# Patient Record
Sex: Female | Born: 1970 | Race: White | Hispanic: No | Marital: Single | Smoking: Never smoker
Health system: Southern US, Community
[De-identification: ages and names within clinical notes are randomized; demographics above are authoritative.]

## PROBLEM LIST (undated history)

## (undated) DIAGNOSIS — G8929 Other chronic pain: Secondary | ICD-10-CM

## (undated) DIAGNOSIS — K219 Gastro-esophageal reflux disease without esophagitis: Secondary | ICD-10-CM

## (undated) DIAGNOSIS — M545 Low back pain: Secondary | ICD-10-CM

## (undated) DIAGNOSIS — R51 Headache: Secondary | ICD-10-CM

## (undated) HISTORY — DX: Low back pain: M54.5

## (undated) HISTORY — DX: Other chronic pain: G89.29

## (undated) HISTORY — DX: Gastro-esophageal reflux disease without esophagitis: K21.9

## (undated) HISTORY — PX: TUBAL LIGATION: SHX77

## (undated) HISTORY — DX: Headache: R51

---

## 1997-09-28 ENCOUNTER — Other Ambulatory Visit: Admission: RE | Admit: 1997-09-28 | Discharge: 1997-09-28 | Payer: Self-pay | Admitting: Obstetrics and Gynecology

## 1998-08-06 ENCOUNTER — Other Ambulatory Visit: Admission: RE | Admit: 1998-08-06 | Discharge: 1998-08-06 | Payer: Self-pay | Admitting: Gynecology

## 1999-03-10 ENCOUNTER — Inpatient Hospital Stay (HOSPITAL_COMMUNITY): Admission: AD | Admit: 1999-03-10 | Discharge: 1999-03-12 | Payer: Self-pay | Admitting: Gynecology

## 1999-04-24 ENCOUNTER — Other Ambulatory Visit: Admission: RE | Admit: 1999-04-24 | Discharge: 1999-04-24 | Payer: Self-pay | Admitting: Gynecology

## 2000-05-26 ENCOUNTER — Other Ambulatory Visit: Admission: RE | Admit: 2000-05-26 | Discharge: 2000-05-26 | Payer: Self-pay | Admitting: Gynecology

## 2003-07-20 ENCOUNTER — Ambulatory Visit (HOSPITAL_COMMUNITY): Admission: RE | Admit: 2003-07-20 | Discharge: 2003-07-20 | Payer: Self-pay | Admitting: Radiology

## 2005-11-28 ENCOUNTER — Other Ambulatory Visit: Admission: RE | Admit: 2005-11-28 | Discharge: 2005-11-28 | Payer: Self-pay | Admitting: Obstetrics and Gynecology

## 2006-04-27 ENCOUNTER — Inpatient Hospital Stay (HOSPITAL_COMMUNITY): Admission: AD | Admit: 2006-04-27 | Discharge: 2006-04-27 | Payer: Self-pay | Admitting: Obstetrics and Gynecology

## 2006-06-10 ENCOUNTER — Inpatient Hospital Stay (HOSPITAL_COMMUNITY): Admission: RE | Admit: 2006-06-10 | Discharge: 2006-06-12 | Payer: Self-pay | Admitting: Obstetrics and Gynecology

## 2008-04-07 DIAGNOSIS — M545 Low back pain, unspecified: Secondary | ICD-10-CM

## 2008-04-07 DIAGNOSIS — G8929 Other chronic pain: Secondary | ICD-10-CM

## 2008-04-07 HISTORY — DX: Other chronic pain: G89.29

## 2008-04-07 HISTORY — DX: Low back pain, unspecified: M54.50

## 2009-04-07 DIAGNOSIS — R519 Headache, unspecified: Secondary | ICD-10-CM

## 2009-04-07 HISTORY — DX: Headache, unspecified: R51.9

## 2010-07-11 ENCOUNTER — Emergency Department (HOSPITAL_COMMUNITY)
Admission: EM | Admit: 2010-07-11 | Discharge: 2010-07-11 | Disposition: A | Discharge status: Home / Self Care | Payer: Managed Care, Other (non HMO) | Attending: Emergency Medicine | Admitting: Emergency Medicine

## 2010-07-11 DIAGNOSIS — K089 Disorder of teeth and supporting structures, unspecified: Secondary | ICD-10-CM | POA: Insufficient documentation

## 2010-07-11 DIAGNOSIS — K029 Dental caries, unspecified: Secondary | ICD-10-CM | POA: Insufficient documentation

## 2012-07-22 ENCOUNTER — Other Ambulatory Visit (INDEPENDENT_AMBULATORY_CARE_PROVIDER_SITE_OTHER): Payer: Managed Care, Other (non HMO)

## 2012-07-22 ENCOUNTER — Encounter: Payer: Self-pay | Admitting: Internal Medicine

## 2012-07-22 ENCOUNTER — Ambulatory Visit (INDEPENDENT_AMBULATORY_CARE_PROVIDER_SITE_OTHER): Payer: Managed Care, Other (non HMO) | Admitting: Internal Medicine

## 2012-07-22 VITALS — BP 116/78 | HR 68 | Temp 99.0°F | Resp 12 | Ht 64.0 in | Wt 197.4 lb

## 2012-07-22 DIAGNOSIS — Z8742 Personal history of other diseases of the female genital tract: Secondary | ICD-10-CM

## 2012-07-22 DIAGNOSIS — R110 Nausea: Secondary | ICD-10-CM

## 2012-07-22 DIAGNOSIS — R634 Abnormal weight loss: Secondary | ICD-10-CM

## 2012-07-22 DIAGNOSIS — Z87898 Personal history of other specified conditions: Secondary | ICD-10-CM

## 2012-07-22 DIAGNOSIS — K219 Gastro-esophageal reflux disease without esophagitis: Secondary | ICD-10-CM

## 2012-07-22 LAB — URINALYSIS, ROUTINE W REFLEX MICROSCOPIC
Bilirubin Urine: NEGATIVE
Ketones, ur: NEGATIVE
Leukocytes, UA: NEGATIVE
Specific Gravity, Urine: >=1.03 (ref 1.000–1.030)
Total Protein, Urine: NEGATIVE
Urine Glucose: NEGATIVE
pH: 6 (ref 5.0–8.0)

## 2012-07-22 LAB — CBC WITH DIFFERENTIAL/PLATELET
Basophils Absolute: 0 10*3/uL (ref 0.0–0.1)
Eosinophils Relative: 12.1 % — ABNORMAL HIGH (ref 0.0–5.0)
HCT: 24.1 % — ABNORMAL LOW (ref 36.0–46.0)
Lymphs Abs: 1.3 10*3/uL (ref 0.7–4.0)
MCV: 53.9 fl — ABNORMAL LOW (ref 78.0–100.0)
Monocytes Absolute: 0.6 10*3/uL (ref 0.1–1.0)
Neutrophils Relative %: 53.1 % (ref 43.0–77.0)
Platelets: 275 10*3/uL (ref 150.0–400.0)
RDW: 20.2 % — ABNORMAL HIGH (ref 11.5–14.6)
WBC: 5.6 10*3/uL (ref 4.5–10.5)

## 2012-07-22 LAB — LIPID PANEL
Cholesterol: 154 mg/dL (ref 0–200)
HDL: 46.9 mg/dL (ref >39.00)
LDL Cholesterol: 98 mg/dL (ref 0–99)
VLDL: 9.4 mg/dL (ref 0.0–40.0)

## 2012-07-22 LAB — COMPREHENSIVE METABOLIC PANEL
ALT: 16 U/L (ref 0–35)
AST: 16 U/L (ref 0–37)
Alkaline Phosphatase: 68 U/L (ref 39–117)
CO2: 23 mEq/L (ref 19–32)
Creatinine, Ser: 0.5 mg/dL (ref 0.4–1.2)
Sodium: 138 mEq/L (ref 135–145)
Total Bilirubin: 0.5 mg/dL (ref 0.3–1.2)
Total Protein: 7 g/dL (ref 6.0–8.3)

## 2012-07-22 LAB — HCG, QUANTITATIVE, PREGNANCY: hCG, Beta Chain, Quant, S: 0.14 m[IU]/mL

## 2012-07-22 LAB — TSH: TSH: 3.39 u[IU]/mL (ref 0.35–5.50)

## 2012-07-22 LAB — LIPASE: Lipase: 23 U/L (ref 11.0–59.0)

## 2012-07-22 LAB — AMYLASE: Amylase: 35 U/L (ref 27–131)

## 2012-07-22 MED ORDER — ONDANSETRON HCL 4 MG PO TABS: 4.0000 mg | ORAL_TABLET | Freq: Three times a day (TID) | ORAL | Status: DC | PRN

## 2012-07-22 NOTE — Progress Notes (Signed)
Subjective:    Patient ID: Barbara Perry, female    DOB: [birth year], 42 y.o.   MRN: [id number]  Gastrophageal Reflux She complains of heartburn and nausea. She reports no abdominal pain, no belching, no chest pain, no choking, no coughing, no dysphagia, no early satiety, no globus sensation, no hoarse voice, no sore throat, no stridor, no tooth decay, no water brash or no wheezing. This is a chronic problem. The current episode started more than 1 year ago. The problem occurs occasionally. The problem has been unchanged. The symptoms are aggravated by medications. Associated symptoms include fatigue and weight loss. Pertinent negatives include no anemia, melena, muscle weakness or orthopnea. Risk factors include NSAIDs and obesity. She has tried a histamine-2 antagonist for the symptoms. The treatment provided moderate relief.      Review of Systems  Constitutional: Positive for weight loss, fatigue and unexpected weight change (weight loss). Negative for fever, chills, diaphoresis, activity change and appetite change.  HENT: Negative.  Negative for nosebleeds, sore throat, hoarse voice, trouble swallowing and voice change.   Eyes: Negative.   Respiratory: Negative.  Negative for cough, choking and wheezing.   Cardiovascular: Negative.  Negative for chest pain, palpitations and leg swelling.  Gastrointestinal: Positive for heartburn and nausea. Negative for dysphagia, vomiting, abdominal pain, diarrhea, constipation, melena, abdominal distention, anal bleeding and rectal pain.  Endocrine: Negative.   Genitourinary: Positive for vaginal bleeding and menstrual problem (heavy and irregular periods). Negative for hematuria, vaginal discharge and vaginal pain.  Musculoskeletal: Negative.  Negative for myalgias, back pain, joint swelling, gait problem and muscle weakness.  Skin: Negative.   Allergic/Immunologic: Negative.   Neurological: Negative.  Negative for dizziness, tremors, seizures,  syncope, facial asymmetry, speech difficulty, weakness, light-headedness and numbness.  Hematological: Negative.  Negative for adenopathy. Does not bruise/bleed easily.  Psychiatric/Behavioral: Negative.        Objective:   Physical Exam  Vitals reviewed. Constitutional: She is oriented to person, place, and time. She appears well-developed and well-nourished.  Non-toxic appearance. She does not have a sickly appearance. She does not appear ill. No distress.  HENT:  Head: Normocephalic and atraumatic.  Mouth/Throat: Oropharynx is clear and moist. No oropharyngeal exudate.  Eyes: Conjunctivae and EOM are normal. Right eye exhibits no discharge. Left eye exhibits no discharge. No scleral icterus.  Neck: Normal range of motion. Neck supple. No JVD present. No tracheal deviation present. No thyromegaly present.  Cardiovascular: Normal rate, regular rhythm, normal heart sounds and intact distal pulses.  Exam reveals no gallop and no friction rub.   No murmur heard. Pulmonary/Chest: Effort normal and breath sounds normal. No stridor. No respiratory distress. She has no wheezes. She has no rales. She exhibits no tenderness.  Abdominal: Soft. Normal appearance and bowel sounds are normal. She exhibits no shifting dullness, no distension, no pulsatile liver, no fluid wave, no abdominal bruit, no ascites, no pulsatile midline mass and no mass. There is no hepatosplenomegaly, splenomegaly or hepatomegaly. There is no tenderness. There is no rigidity, no rebound, no guarding, no CVA tenderness, no tenderness at McBurney's point and negative Murphy's sign. No hernia. Hernia confirmed negative in the ventral area, confirmed negative in the right inguinal area and confirmed negative in the left inguinal area.  Genitourinary: Rectum normal. Rectal exam shows no external hemorrhoid, no internal hemorrhoid, no fissure, no mass, no tenderness and anal tone normal. Guaiac negative stool.  Musculoskeletal: Normal  range of motion. She exhibits no edema and no tenderness.  Lymphadenopathy:  She has no cervical adenopathy.  Neurological: She is alert and oriented to person, place, and time. She has normal strength. She displays no atrophy, no tremor and normal reflexes. No cranial nerve deficit or sensory deficit. She exhibits normal muscle tone. She displays a negative Romberg sign. She displays no seizure activity. Coordination and gait normal. She displays no Babinski's sign on the right side. She displays no Babinski's sign on the left side.  Reflex Scores:      Tricep reflexes are 1+ on the right side and 1+ on the left side.      Bicep reflexes are 1+ on the right side and 1+ on the left side.      Brachioradialis reflexes are 1+ on the right side and 1+ on the left side.      Patellar reflexes are 1+ on the right side and 1+ on the left side.      Achilles reflexes are 1+ on the right side and 1+ on the left side. Skin: Skin is warm and dry. No rash noted. She is not diaphoretic. No erythema. No pallor.  Psychiatric: She has a normal mood and affect. Her behavior is normal. Judgment and thought content normal.     No results found for this basename: WBC, HGB, HCT, PLT, GLUCOSE, CHOL, TRIG, HDL, LDLDIRECT, LDLCALC, ALT, AST, NA, K, CL, CREATININE, BUN, CO2, TSH, PSA, INR, GLUF, HGBA1C, MICROALBUR       Assessment & Plan:

## 2012-07-22 NOTE — Patient Instructions (Signed)
Nausea and Vomiting Nausea is a sick feeling that often comes before throwing up (vomiting). Vomiting is a reflex where stomach contents come out of your mouth. Vomiting can cause severe loss of body fluids (dehydration). Children and elderly adults can become dehydrated quickly, especially if they also have diarrhea. Nausea and vomiting are symptoms of a condition or disease. It is important to find the cause of your symptoms. CAUSES   Direct irritation of the stomach lining. This irritation can result from increased acid production (gastroesophageal reflux disease), infection, food poisoning, taking certain medicines (such as nonsteroidal anti-inflammatory drugs), alcohol use, or tobacco use.  Signals from the brain.These signals could be caused by a headache, heat exposure, an inner ear disturbance, increased pressure in the brain from injury, infection, a tumor, or a concussion, pain, emotional stimulus, or metabolic problems.  An obstruction in the gastrointestinal tract (bowel obstruction).  Illnesses such as diabetes, hepatitis, gallbladder problems, appendicitis, kidney problems, cancer, sepsis, atypical symptoms of a heart attack, or eating disorders.  Medical treatments such as chemotherapy and radiation.  Receiving medicine that makes you sleep (general anesthetic) during surgery. DIAGNOSIS Your caregiver may ask for tests to be done if the problems do not improve after a few days. Tests may also be done if symptoms are severe or if the reason for the nausea and vomiting is not clear. Tests may include:  Urine tests.  Blood tests.  Stool tests.  Cultures (to look for evidence of infection).  X-rays or other imaging studies. Test results can help your caregiver make decisions about treatment or the need for additional tests. TREATMENT You need to stay well hydrated. Drink frequently but in small amounts.You may wish to drink water, sports drinks, clear broth, or eat frozen  ice pops or gelatin dessert to help stay hydrated.When you eat, eating slowly may help prevent nausea.There are also some antinausea medicines that may help prevent nausea. HOME CARE INSTRUCTIONS   Take all medicine as directed by your caregiver.  If you do not have an appetite, do not force yourself to eat. However, you must continue to drink fluids.  If you have an appetite, eat a normal diet unless your caregiver tells you differently.  Eat a variety of complex carbohydrates (rice, wheat, potatoes, bread), lean meats, yogurt, fruits, and vegetables.  Avoid high-fat foods because they are more difficult to digest.  Drink enough water and fluids to keep your urine clear or pale yellow.  If you are dehydrated, ask your caregiver for specific rehydration instructions. Signs of dehydration may include:  Severe thirst.  Dry lips and mouth.  Dizziness.  Dark urine.  Decreasing urine frequency and amount.  Confusion.  Rapid breathing or pulse. SEEK IMMEDIATE MEDICAL CARE IF:   You have blood or brown flecks (like coffee grounds) in your vomit.  You have black or bloody stools.  You have a severe headache or stiff neck.  You are confused.  You have severe abdominal pain.  You have chest pain or trouble breathing.  You do not urinate at least once every 8 hours.  You develop cold or clammy skin.  You continue to vomit for longer than 24 to 48 hours.  You have a fever. MAKE SURE YOU:   Understand these instructions.  Will watch your condition.  Will get help right away if you are not doing well or get worse. Document Released: 03/24/2005 Document Revised: 06/16/2011 Document Reviewed: 08/21/2010 Exit[employer] Patient Information 2013 [employer].  

## 2012-07-23 ENCOUNTER — Encounter: Payer: Self-pay | Admitting: Internal Medicine

## 2012-07-25 NOTE — Assessment & Plan Note (Signed)
Continue zantac I may refer to GI to consider an UGI evaluation depending on the lab results

## 2012-07-25 NOTE — Assessment & Plan Note (Signed)
GYN referral 

## 2012-07-25 NOTE — Assessment & Plan Note (Signed)
I will check her labs to look for secondary causes

## 2012-07-25 NOTE — Assessment & Plan Note (Addendum)
I think this is caused by the GERD, I will check labs today to look for other causes (preganacy, pancreatitis, liver disease, thyroid disease, renal disease). She will take zofran as needed for nausea.

## 2012-07-26 ENCOUNTER — Telehealth: Payer: Self-pay

## 2012-07-26 ENCOUNTER — Encounter: Payer: Self-pay | Admitting: Internal Medicine

## 2012-07-26 ENCOUNTER — Encounter: Payer: Self-pay | Admitting: *Deleted

## 2012-07-26 ENCOUNTER — Other Ambulatory Visit (INDEPENDENT_AMBULATORY_CARE_PROVIDER_SITE_OTHER): Payer: Managed Care, Other (non HMO)

## 2012-07-26 ENCOUNTER — Ambulatory Visit (INDEPENDENT_AMBULATORY_CARE_PROVIDER_SITE_OTHER): Payer: Managed Care, Other (non HMO) | Admitting: Internal Medicine

## 2012-07-26 VITALS — BP 108/80 | HR 88 | Temp 98.7°F | Resp 12 | Wt 198.4 lb

## 2012-07-26 DIAGNOSIS — Z1231 Encounter for screening mammogram for malignant neoplasm of breast: Secondary | ICD-10-CM | POA: Insufficient documentation

## 2012-07-26 DIAGNOSIS — D509 Iron deficiency anemia, unspecified: Secondary | ICD-10-CM | POA: Insufficient documentation

## 2012-07-26 DIAGNOSIS — D539 Nutritional anemia, unspecified: Secondary | ICD-10-CM

## 2012-07-26 LAB — FERRITIN: Ferritin: 1.2 ng/mL — ABNORMAL LOW (ref 10.0–291.0)

## 2012-07-26 LAB — FOLATE: Folate: 9.7 ng/mL (ref >5.9)

## 2012-07-26 LAB — CBC WITH DIFFERENTIAL/PLATELET
Basophils Absolute: 0 10*3/uL (ref 0.0–0.1)
Basophils Relative: 0 % (ref 0.0–3.0)
Eosinophils Absolute: 0.6 10*3/uL (ref 0.0–0.7)
Hemoglobin: 7.1 g/dL — CL (ref 12.0–15.0)
Lymphocytes Relative: 23.1 % (ref 12.0–46.0)
MCHC: 29.9 g/dL — ABNORMAL LOW (ref 30.0–36.0)
Monocytes Relative: 8.8 % (ref 3.0–12.0)
Neutrophils Relative %: 55.9 % (ref 43.0–77.0)
RBC: 4.4 Mil/uL (ref 3.87–5.11)

## 2012-07-26 LAB — IBC PANEL
Iron: 9 ug/dL — ABNORMAL LOW (ref 42–145)
Saturation Ratios: 1.9 % — ABNORMAL LOW (ref 20.0–50.0)

## 2012-07-26 MED ORDER — FERROUS SULFATE 325 (65 FE) MG PO TABS: 325.0000 mg | ORAL_TABLET | Freq: Three times a day (TID) | ORAL | Status: DC

## 2012-07-26 NOTE — Assessment & Plan Note (Signed)
Start iron replacement therapy She has been referred to GYN for further evaluation of the heavy periods

## 2012-07-26 NOTE — Patient Instructions (Signed)

## 2012-07-26 NOTE — Progress Notes (Signed)
  Subjective:    Patient ID: Barbara Perry, female    DOB: [birth year], 42 y.o.   MRN: [id number]  Anemia Presents for follow-up visit. Symptoms include pica. There has been no abdominal pain, anorexia, bruising/bleeding easily, confusion, fever, leg swelling, light-headedness, malaise/fatigue, pallor, palpitations, paresthesias or weight loss. Signs of blood loss that are present include menorrhagia. Signs of blood loss that are not present include hematemesis, hematochezia and melena. There are no compliance problems.       Review of Systems  Constitutional: Negative.  Negative for fever, chills, weight loss, malaise/fatigue, diaphoresis, activity change, appetite change, fatigue and unexpected weight change.  HENT: Negative.   Eyes: Negative.   Respiratory: Negative.  Negative for cough and chest tightness.   Cardiovascular: Negative.  Negative for chest pain, palpitations and leg swelling.  Gastrointestinal: Negative.  Negative for nausea, vomiting, abdominal pain, diarrhea, constipation, blood in stool, melena, hematochezia, abdominal distention, anal bleeding, rectal pain, anorexia and hematemesis.  Endocrine: Negative.   Genitourinary: Positive for menorrhagia. Negative for hematuria.  Musculoskeletal: Negative.   Skin: Negative.  Negative for pallor.  Allergic/Immunologic: Negative.   Neurological: Negative.  Negative for dizziness, weakness, light-headedness, numbness and paresthesias.  Hematological: Negative.  Negative for adenopathy. Does not bruise/bleed easily.  Psychiatric/Behavioral: Negative.  Negative for confusion.       Objective:   Physical Exam  Vitals reviewed. Constitutional: She is oriented to person, place, and time. She appears well-developed and well-nourished. No distress.  HENT:  Head: Normocephalic and atraumatic.  Mouth/Throat: Oropharynx is clear and moist. No oropharyngeal exudate.  Eyes: Conjunctivae are normal. Right eye exhibits no discharge.  Left eye exhibits no discharge. No scleral icterus.  Neck: Normal range of motion. Neck supple. No JVD present. No tracheal deviation present. No thyromegaly present.  Cardiovascular: Normal rate, regular rhythm, normal heart sounds and intact distal pulses.  Exam reveals no gallop and no friction rub.   No murmur heard. Pulmonary/Chest: Effort normal and breath sounds normal. No stridor. No respiratory distress. She has no wheezes. She has no rales. She exhibits no tenderness.  Abdominal: Soft. Bowel sounds are normal. She exhibits no distension and no mass. There is no tenderness. There is no rebound and no guarding.  Musculoskeletal: Normal range of motion. She exhibits no edema and no tenderness.  Lymphadenopathy:    She has no cervical adenopathy.  Neurological: She is oriented to person, place, and time.  Skin: Skin is warm and dry. No rash noted. She is not diaphoretic. No erythema. No pallor.  Psychiatric: She has a normal mood and affect. Her behavior is normal. Judgment and thought content normal.     Lab Results  Component Value Date   WBC 5.6 07/22/2012   HGB 7.2 Repeated and verified X2.* 07/22/2012   HCT 24.1 Repeated and verified X2.* 07/22/2012   PLT 275.0 07/22/2012   GLUCOSE 86 07/22/2012   CHOL 154 07/22/2012   TRIG 47.0 07/22/2012   HDL 46.90 07/22/2012   LDLCALC 98 07/22/2012   ALT 16 07/22/2012   AST 16 07/22/2012   NA 138 07/22/2012   K 4.2 07/22/2012   CL 109 07/22/2012   CREATININE 0.5 07/22/2012   BUN 9 07/22/2012   CO2 23 07/22/2012   TSH 3.39 07/22/2012       Assessment & Plan:

## 2012-07-26 NOTE — Addendum Note (Signed)
Addended by: Lyanne Co R on: 07/26/2012 04:42 PM   Modules accepted: Orders

## 2012-07-26 NOTE — Progress Notes (Signed)
Phone call to pt, LM for her to call back with what pharmacy she uses so a prescription can be sent over for her.   Ferrous Sulfate electronically sent to [facility] on [address] per pt

## 2012-07-26 NOTE — Telephone Encounter (Signed)
Critical Lab - Hemoglobin 701 Hematocrit 23.5

## 2012-08-16 ENCOUNTER — Ambulatory Visit: Payer: Managed Care, Other (non HMO) | Admitting: Internal Medicine

## 2012-08-16 ENCOUNTER — Ambulatory Visit (HOSPITAL_COMMUNITY)
Admission: RE | Admit: 2012-08-16 | Discharge: 2012-08-16 | Disposition: A | Discharge status: Home / Self Care | Payer: Managed Care, Other (non HMO) | Source: Ambulatory Visit | Attending: Internal Medicine | Admitting: Internal Medicine

## 2012-08-16 DIAGNOSIS — Z1231 Encounter for screening mammogram for malignant neoplasm of breast: Secondary | ICD-10-CM | POA: Insufficient documentation

## 2012-08-18 ENCOUNTER — Encounter: Payer: Managed Care, Other (non HMO) | Admitting: Obstetrics & Gynecology

## 2012-08-25 ENCOUNTER — Ambulatory Visit: Payer: Managed Care, Other (non HMO) | Admitting: Internal Medicine

## 2012-09-03 ENCOUNTER — Encounter: Payer: Self-pay | Admitting: Internal Medicine

## 2012-09-03 ENCOUNTER — Ambulatory Visit (INDEPENDENT_AMBULATORY_CARE_PROVIDER_SITE_OTHER): Payer: Managed Care, Other (non HMO) | Admitting: Internal Medicine

## 2012-09-03 ENCOUNTER — Other Ambulatory Visit (INDEPENDENT_AMBULATORY_CARE_PROVIDER_SITE_OTHER): Payer: Managed Care, Other (non HMO)

## 2012-09-03 VITALS — BP 110/78 | HR 72 | Temp 98.6°F | Resp 16 | Ht 64.0 in | Wt 201.0 lb

## 2012-09-03 DIAGNOSIS — D519 Vitamin B12 deficiency anemia, unspecified: Secondary | ICD-10-CM

## 2012-09-03 DIAGNOSIS — D518 Other vitamin B12 deficiency anemias: Secondary | ICD-10-CM

## 2012-09-03 DIAGNOSIS — E669 Obesity, unspecified: Secondary | ICD-10-CM

## 2012-09-03 DIAGNOSIS — D509 Iron deficiency anemia, unspecified: Secondary | ICD-10-CM

## 2012-09-03 LAB — FERRITIN: Ferritin: 31.7 ng/mL (ref 10.0–291.0)

## 2012-09-03 LAB — CBC WITH DIFFERENTIAL/PLATELET
Basophils Absolute: 0 10*3/uL (ref 0.0–0.1)
Eosinophils Absolute: 0.1 10*3/uL (ref 0.0–0.7)
HCT: 35.3 % — ABNORMAL LOW (ref 36.0–46.0)
Hemoglobin: 11.4 g/dL — ABNORMAL LOW (ref 12.0–15.0)
Lymphs Abs: 1.3 10*3/uL (ref 0.7–4.0)
MCHC: 32.2 g/dL (ref 30.0–36.0)
Neutro Abs: 3.3 10*3/uL (ref 1.4–7.7)
Platelets: 224 10*3/uL (ref 150.0–400.0)
RDW: 34.1 % — ABNORMAL HIGH (ref 11.5–14.6)

## 2012-09-03 LAB — IBC PANEL: Iron: 24 ug/dL — ABNORMAL LOW (ref 42–145)

## 2012-09-03 MED ORDER — LORCASERIN HCL 10 MG PO TABS
1.0000 | ORAL_TABLET | Freq: Two times a day (BID) | ORAL | Status: DC
Start: 2012-09-03 — End: 2012-12-27

## 2012-09-03 MED ORDER — LORCASERIN HCL 10 MG PO TABS: 1.0000 | ORAL_TABLET | Freq: Two times a day (BID) | ORAL | Status: DC

## 2012-09-03 MED ORDER — CYANOCOBALAMIN 100 MCG PO TABS: 100.0000 ug | ORAL_TABLET | Freq: Every day | ORAL | Status: DC

## 2012-09-03 NOTE — Assessment & Plan Note (Signed)
CBC and iron level look much better today

## 2012-09-03 NOTE — Assessment & Plan Note (Signed)
She wants to lose weight so I recommended belviq

## 2012-09-03 NOTE — Patient Instructions (Signed)
Anemia, Nonspecific  Your exam and blood tests show you are anemic. This means your blood (hemoglobin) level is low. Normal hemoglobin values are 12 to 15 g/dL for females and 14 to 17 g/dL for males. Make a note of your hemoglobin level today. The hematocrit percent is also used to measure anemia. A normal hematocrit is 38% to 46% in females and 42% to 49% in males. Make a note of your hematocrit level today.  CAUSES   Anemia can be due to many different causes.   Excessive bleeding from periods (in women).   Intestinal bleeding.   Poor nutrition.   Kidney, thyroid, liver, and bone marrow diseases.  SYMPTOMS   Anemia can come on suddenly (acute). It can also come on slowly. Symptoms can include:   Minor weakness.   Dizziness.   Palpitations.   Shortness of breath.  Symptoms may be absent until half your hemoglobin is missing if it comes on slowly. Anemia due to acute blood loss from an injury or internal bleeding may require blood transfusion if the loss is severe. Hospital care is needed if you are anemic and there is significant continual blood loss.  TREATMENT    Stool tests for blood (Hemoccult) and additional lab tests are often needed. This determines the best treatment.   Further checking on your condition and your response to treatment is very important. It often takes many weeks to correct anemia.  Depending on the cause, treatment can include:   Supplements of iron.   Vitamins B12 and folic acid.   Hormone medicines.  If your anemia is due to bleeding, finding the cause of the blood loss is very important. This will help avoid further problems.  SEEK IMMEDIATE MEDICAL CARE IF:    You develop fainting, extreme weakness, shortness of breath, or chest pain.   You develop heavy vaginal bleeding.   You develop bloody or black, tarry stools or vomit up blood.   You develop a high fever, rash, repeated vomiting, or dehydration.  Document Released: 05/01/2004 Document Revised: 06/16/2011 Document  Reviewed: 02/06/2009  [employer] Patient Information 2014 [employer].

## 2012-09-03 NOTE — Assessment & Plan Note (Addendum)
Start oral B12 replacements I will check her labs for celiac disease today

## 2012-09-03 NOTE — Progress Notes (Signed)
Subjective:    Patient ID: Barbara Perry, female    DOB: Sep 18, 1970, 42 y.o.   MRN: [id number]  Anemia Presents for follow-up visit. Symptoms include malaise/fatigue and paresthesias. There has been no abdominal pain, anorexia, bruising/bleeding easily, confusion, fever, leg swelling, light-headedness, pallor, palpitations, pica or weight loss. Signs of blood loss that are not present include hematemesis, hematochezia, melena, menorrhagia and vaginal bleeding. There are no compliance problems.  Compliance with medications is 76-100%.      Review of Systems  Constitutional: Positive for malaise/fatigue, fatigue and unexpected weight change (weight gain). Negative for fever, chills, weight loss, diaphoresis, activity change and appetite change.  HENT: Negative.   Eyes: Negative.   Respiratory: Negative.  Negative for cough, chest tightness, shortness of breath, wheezing and stridor.   Cardiovascular: Negative.  Negative for chest pain, palpitations and leg swelling.  Gastrointestinal: Negative.  Negative for nausea, vomiting, abdominal pain, diarrhea, constipation, melena, hematochezia, abdominal distention, anal bleeding, rectal pain, anorexia and hematemesis.  Endocrine: Negative.   Genitourinary: Negative.  Negative for frequency, hematuria, flank pain, vaginal bleeding and menorrhagia.  Musculoskeletal: Negative.  Negative for myalgias, back pain and joint swelling.  Skin: Negative.  Negative for color change, pallor, rash and wound.  Allergic/Immunologic: Negative.   Neurological: Positive for numbness (in her hands) and paresthesias. Negative for dizziness, weakness and light-headedness.  Hematological: Negative.  Does not bruise/bleed easily.  Psychiatric/Behavioral: Negative.  Negative for confusion.       Objective:   Physical Exam  Vitals reviewed. Constitutional: She is oriented to person, place, and time. She appears well-developed and well-nourished. No distress.  HENT:   Head: Normocephalic and atraumatic.  Mouth/Throat: Oropharynx is clear and moist. No oropharyngeal exudate.  Eyes: Conjunctivae are normal. Right eye exhibits no discharge. Left eye exhibits no discharge. No scleral icterus.  Neck: Normal range of motion. Neck supple. No JVD present. No tracheal deviation present. No thyromegaly present.  Cardiovascular: Normal rate, regular rhythm, normal heart sounds and intact distal pulses.  Exam reveals no gallop and no friction rub.   No murmur heard. Pulmonary/Chest: Effort normal and breath sounds normal. No stridor. No respiratory distress. She has no wheezes. She has no rales. She exhibits no tenderness.  Abdominal: Soft. Bowel sounds are normal. She exhibits no distension and no mass. There is no tenderness. There is no rebound and no guarding.  Musculoskeletal: Normal range of motion. She exhibits no edema and no tenderness.  Lymphadenopathy:    She has no cervical adenopathy.  Neurological: She is alert and oriented to person, place, and time. She has normal reflexes. She displays normal reflexes. No cranial nerve deficit. She exhibits normal muscle tone. Coordination normal.  Skin: Skin is warm and dry. No rash noted. She is not diaphoretic. No erythema. No pallor.  Psychiatric: She has a normal mood and affect. Her behavior is normal. Judgment and thought content normal.     Lab Results  Component Value Date   WBC 5.3 07/26/2012   HGB 7.1 Repeated and verified X2.* 07/26/2012   HCT 23.5 Repeated and verified X2.* 07/26/2012   PLT 311.0 07/26/2012   GLUCOSE 86 07/22/2012   CHOL 154 07/22/2012   TRIG 47.0 07/22/2012   HDL 46.90 07/22/2012   LDLCALC 98 07/22/2012   ALT 16 07/22/2012   AST 16 07/22/2012   NA 138 07/22/2012   K 4.2 07/22/2012   CL 109 07/22/2012   CREATININE 0.5 07/22/2012   BUN 9 07/22/2012   CO2 23 07/22/2012  TSH 3.39 07/22/2012       Assessment & Plan:

## 2012-09-06 ENCOUNTER — Encounter: Payer: Self-pay | Admitting: Internal Medicine

## 2012-09-06 LAB — GLIADIN ANTIBODIES, SERUM: Gliadin IgG: 9.3 U/mL (ref <20)

## 2012-09-07 LAB — RETICULIN ANTIBODIES, IGA W TITER: Reticulin Ab, IgA: NEGATIVE

## 2012-09-08 ENCOUNTER — Encounter: Payer: Self-pay | Admitting: Obstetrics & Gynecology

## 2012-09-08 ENCOUNTER — Ambulatory Visit (INDEPENDENT_AMBULATORY_CARE_PROVIDER_SITE_OTHER): Payer: Managed Care, Other (non HMO) | Admitting: Obstetrics & Gynecology

## 2012-09-08 VITALS — BP 118/74 | HR 69 | Temp 97.0°F | Ht 64.0 in | Wt 199.3 lb

## 2012-09-08 DIAGNOSIS — N915 Oligomenorrhea, unspecified: Secondary | ICD-10-CM

## 2012-09-08 DIAGNOSIS — Z01419 Encounter for gynecological examination (general) (routine) without abnormal findings: Secondary | ICD-10-CM

## 2012-09-08 DIAGNOSIS — D649 Anemia, unspecified: Secondary | ICD-10-CM

## 2012-09-08 LAB — CBC
HCT: 38 % (ref 36.0–46.0)
Hemoglobin: 12.3 g/dL (ref 12.0–15.0)
MCHC: 32.4 g/dL (ref 30.0–36.0)
WBC: 5.1 10*3/uL (ref 4.0–10.5)

## 2012-09-08 NOTE — Progress Notes (Signed)
Patient ID: Barbara Perry, female   DOB: [birth year], 42 y.o.   MRN: [id number] Subjective:     Barbara Perry is a 42 y.o. female here for a routine exam.  Current complaints: oligomenorrhea- not bothersome to patient.  Prior to the past 4 months pt had heavy menses 1 wk cycles and heavy. No clots.  Monogamous x 14 years. No h/o STI.  Pt with h/o abnormal PAP 13 years ago.  Last PA was 2009 and it was normal.    Gynecologic History Patient's last menstrual period was 07/29/2012. Contraception: bilateral tubal ligation 2008 Last Pap:2009 Results were: normal Last mammogram: 08/2012. Results were: normal  Obstetric History OB History   Grav Para Term Preterm Abortions TAB SAB Ect Mult Living   2 2 2  0 0 0 0 0 0 2     # Outc Date GA Lbr Len/2nd Wgt Sex Del Anes PTL Lv   1 TRM      SVD      2 TRM      SVD        Current Outpatient Prescriptions on File Prior to Visit  Medication Sig Dispense Refill  . cyanocobalamin ([facility] VITAMIN B12) 100 MCG tablet Take 1 tablet (100 mcg total) by mouth daily.  90 tablet  3  . ferrous sulfate (FEOSOL) 325 (65 FE) MG tablet Take 1 tablet (325 mg total) by mouth 3 (three) times daily with meals.  90 tablet  3  . Lorcaserin HCl (BELVIQ) 10 MG TABS Take 1 tablet by mouth 2 (two) times daily.  60 tablet  3  . ondansetron (ZOFRAN) 4 MG tablet Take 1 tablet (4 mg total) by mouth every 8 (eight) hours as needed for nausea.  35 tablet  1  . ranitidine (ZANTAC) 150 MG tablet Take 1 tablet (150 mg total) by mouth 2 (two) times daily.  60 tablet  0   No current facility-administered medications on file prior to visit.     The following portions of the patient's history were reviewed and updated as appropriate: allergies, current medications, past family history, past medical history, past social history, past surgical history and problem list.  Review of Systems A comprehensive review of systems was negative.    Objective:    BP 118/74  Pulse 69   Temp(Src) 97 F (36.1 C) (Oral)  Ht 5\' 4"  (1.626 m)  Wt 199 lb 4.8 oz (90.402 kg)  BMI 34.19 kg/m2  LMP 07/29/2012  General Appearance:    Alert, cooperative, no distress, appears stated age           Nose:   Nares normal, septum midline, mucosa normal, no drainage    or sinus tenderness  Throat:   Lips, mucosa, and tongue normal; teeth and gums normal  Neck:   Supple, symmetrical, trachea midline, no adenopathy;    thyroid:  no enlargement/tenderness/nodules; no carotid   bruit or JVD  Back:     Symmetric, no curvature, ROM normal, no CVA tenderness  Lungs:     Clear to auscultation bilaterally, respirations unlabored  Chest Wall:    No tenderness or deformity   Heart:    Regular rate and rhythm, S1 and S2 normal, no murmur, rub   or gallop  Breast Exam:    No tenderness, masses, or nipple abnormality  Abdomen:     Soft, non-tender, bowel sounds active all four quadrants,    no masses, no organomegaly  Genitalia:    Normal female without  lesion, discharge or tenderness     Extremities:   Extremities normal, atraumatic, no cyanosis or edema  Pulses:   2+ and symmetric all extremities  Skin:   Skin color, texture, turgor normal, no rashes or lesions  Lymph nodes:   Cervical, supraclavicular, and axillary nodes normal         Assessment:    Healthy female exam.  H/o menorrhagia followed by oligomenorrhea.  Possible early menopause.  Will check labs to see   Plan:   F/u PAP and cx Labs: CBC, TSH and FSH  Follow up in: 1 year.

## 2012-09-08 NOTE — Patient Instructions (Signed)
HPV Test The HPV (human papillomavirus) test is used to screen for high-risk types with HPV infection. HPV is a group of about 100 related viruses, of which 40 types are genital viruses. Most HPV viruses cause infections that usually resolve without treatment within 2 years. Some HPV infections can cause skin and genital warts (condylomata). HPV types 16, 18, 31 and 45 are considered high-risk types of HPV. High-risk types of HPV do not usually cause visible warts, but if untreated, may lead to cancers of the outlet of the womb (cervix) or anus. An HPV test identifies the DNA (genetic) strands of the HPV infection. Because the test identifies the DNA strands, the test is also referred to as the HPV DNA test. Although HPV is found in both males and females, the HPV test is only used to screen for cervical cancer in females. This test is recommended for females:  With an abnormal Pap test.  After treatment of an abnormal Pap test.  Aged 39 and older.  After treatment of a high-risk HPV infection. The HPV test may be done at the same time as a Pap test in females over the age of 46. Both the HPV and Pap test require a sample of cells from the cervix. PREPARATION FOR TEST  You may be asked to avoid douching, tampons, or vaginal medicines for 48 hours before the HPV test. You will be asked to urinate before the test. For the HPV test, you will need to lie on an exam table with your feet in stirrups. A spatula will be inserted into the vagina. The spatula will be used to swab the cervix for a cell and mucus sample. The sample will be further evaluated in a lab under a microscope. NORMAL FINDINGS  Normal: High-risk HPV is not found.  Ranges for normal findings may vary among different laboratories and hospitals. You should always check with your doctor after having lab work or other tests done to discuss the meaning of your test results and whether your values are considered within normal limits. MEANING  OF TEST An abnormal HPV test means that high-risk HPV is found. Your caregiver may recommend further testing. Your caregiver will go over the test results with you. He or she will and discuss the importance and meaning of your results, as well as treatment options and the need for additional tests, if necessary. OBTAINING THE RESULTS  It is your responsibility to obtain your test results. Ask the lab or department performing the test when and how you will get your results. Document Released: 04/18/2004 Document Revised: 06/16/2011 Document Reviewed: 01/01/2005 [facility] Patient Information 2014 [place], [place]. Pap Test A Pap test is a procedure done in a clinic office to evaluate cells that are on the surface of the cervix. The cervix is the lower portion of the uterus and upper portion of the vagina. For some women, the cervical region has the potential to form cancer. With consistent evaluations by your caregiver, this type of cancer can be prevented.  If a Pap test is abnormal, it is most often a result of a previous exposure to human papillomavirus (HPV). HPV is a virus that can infect the cells of the cervix and cause dysplasia. Dysplasia is where the cells no longer look normal. If a woman has been diagnosed with high-grade or severe dysplasia, they are at higher risk of developing cervical cancer. People diagnosed with low-grade dysplasia should still be seen by their caregiver because there is a small chance that  low-grade dysplasia could develop into cancer.  LET YOUR CAREGIVER KNOW ABOUT:  Recent sexually transmitted infection (STI) you have had.  Any new sex partners you have had.  History of previous abnormal Pap tests results.  History of previous cervical procedures you have had (colposcopy, biopsy, loop electrosurgical excision procedure [LEEP]).  Concerns you have had regarding unusual vaginal discharge.  History of pelvic pain.  Your use of birth control. BEFORE THE  PROCEDURE  Ask your caregiver when to schedule your Pap test. It is best not to be on your period if your caregiver uses a wooden spatula to collect cells or applies cells to a glass slide. Newer techniques are not so sensitive to the timing of a menstrual cycle.  Do not douche or have sexual intercourse for 24 hours before the test.   Do not use vaginal creams or tampons for 24 hours before the test.   Empty your bladder just before the test to lessen any discomfort.  PROCEDURE You will lie on an exam table with your feet in stirrups. A warm metal or plastic instrument (speculum) is placed in your vagina. This instrument allows your caregiver to see the inside of your vagina and look at your cervix. A small, plastic brush or wooden spatula is then used to collect cervical cells. These cells are placed in a lab specimen container. The cells are looked at under a microscope. A specialist will determine if the cells are normal.  AFTER THE PROCEDURE Make sure to get your test results.If your results come back abnormal, you may need further testing.  Document Released: 06/14/2002 Document Revised: 06/16/2011 Document Reviewed: 03/20/2011 [facility] Patient Information 2014 [place], [place].

## 2012-09-09 LAB — FOLLICLE STIMULATING HORMONE: FSH: 9.8 m[IU]/mL

## 2012-10-25 ENCOUNTER — Ambulatory Visit: Payer: Managed Care, Other (non HMO) | Admitting: Internal Medicine

## 2012-12-01 ENCOUNTER — Other Ambulatory Visit: Payer: Managed Care, Other (non HMO)

## 2012-12-01 ENCOUNTER — Encounter: Payer: Self-pay | Admitting: Internal Medicine

## 2012-12-01 ENCOUNTER — Ambulatory Visit (INDEPENDENT_AMBULATORY_CARE_PROVIDER_SITE_OTHER): Payer: Managed Care, Other (non HMO) | Admitting: Internal Medicine

## 2012-12-01 VITALS — BP 120/78 | HR 75 | Temp 98.8°F | Resp 16

## 2012-12-01 DIAGNOSIS — Z22322 Carrier or suspected carrier of Methicillin resistant Staphylococcus aureus: Secondary | ICD-10-CM | POA: Insufficient documentation

## 2012-12-01 DIAGNOSIS — L0291 Cutaneous abscess, unspecified: Secondary | ICD-10-CM

## 2012-12-01 MED ORDER — DOXYCYCLINE HYCLATE 100 MG PO TBEC: 100.0000 mg | DELAYED_RELEASE_TABLET | Freq: Two times a day (BID) | ORAL | Status: DC

## 2012-12-01 MED ORDER — HYDROCODONE-ACETAMINOPHEN 5-325 MG PO TABS: 1.0000 | ORAL_TABLET | Freq: Four times a day (QID) | ORAL | Status: DC | PRN

## 2012-12-01 NOTE — Patient Instructions (Signed)

## 2012-12-01 NOTE — Progress Notes (Signed)
  Subjective:    Patient ID: Barbara Perry, female    DOB: 03-Dec-1970, 42 y.o.   MRN: [id number]  HPI  For the last week she has had boils with mild pain, erythema, and drainage from her right axilla.  Review of Systems  Constitutional: Negative.  Negative for fever, chills, diaphoresis and fatigue.  HENT: Negative.   Eyes: Negative.   Respiratory: Negative.  Negative for cough, chest tightness, shortness of breath, wheezing and stridor.   Cardiovascular: Negative.  Negative for chest pain, palpitations and leg swelling.  Gastrointestinal: Negative.  Negative for nausea, vomiting, abdominal pain, diarrhea, constipation and blood in stool.  Endocrine: Negative.   Genitourinary: Negative.   Musculoskeletal: Negative.   Skin: Negative.   Allergic/Immunologic: Negative.   Neurological: Negative.   Hematological: Negative.  Negative for adenopathy. Does not bruise/bleed easily.  Psychiatric/Behavioral: Negative.        Objective:   Physical Exam  Vitals reviewed. Constitutional: She is oriented to person, place, and time. She appears well-developed and well-nourished. No distress.  HENT:  Head: Normocephalic and atraumatic.  Mouth/Throat: Oropharynx is clear and moist. No oropharyngeal exudate.  Eyes: Conjunctivae are normal. Right eye exhibits no discharge. Left eye exhibits no discharge. No scleral icterus.  Neck: Normal range of motion. Neck supple. No JVD present. No tracheal deviation present. No thyromegaly present.  Cardiovascular: Normal rate, regular rhythm and intact distal pulses.  Exam reveals no gallop and no friction rub.   No murmur heard. Pulmonary/Chest: Effort normal and breath sounds normal. No stridor. No respiratory distress. She has no wheezes. She has no rales. She exhibits no tenderness.    The two areas of fluctuance were cleaned with betadine then they were prepped and draped in sterile fashion. Local anesthesia was obtained with 2% lidocaine w epi. Then  a 4 mm punch incision was made into each area, this revealed abscesses with some deep pockets and loculations. The cavities were explored, loculations were disrupted, irrigated with H2O2 and then packed with iodoform. Culture was sent. A dressing was applied. She tolerated this well.    Abdominal: Soft. Bowel sounds are normal. She exhibits no distension and no mass. There is no tenderness. There is no rebound and no guarding.  Musculoskeletal: Normal range of motion. She exhibits no edema and no tenderness.  Lymphadenopathy:    She has no cervical adenopathy.  Neurological: She is oriented to person, place, and time.  Skin: Skin is warm and dry. No rash noted. She is not diaphoretic. No erythema. No pallor.     Lab Results  Component Value Date   WBC 5.1 09/08/2012   HGB 12.3 09/08/2012   HCT 38.0 09/08/2012   PLT 266 09/08/2012   GLUCOSE 86 07/22/2012   CHOL 154 07/22/2012   TRIG 47.0 07/22/2012   HDL 46.90 07/22/2012   LDLCALC 98 07/22/2012   ALT 16 07/22/2012   AST 16 07/22/2012   NA 138 07/22/2012   K 4.2 07/22/2012   CL 109 07/22/2012   CREATININE 0.5 07/22/2012   BUN 9 07/22/2012   CO2 23 07/22/2012   TSH 2.493 09/08/2012       Assessment & Plan:

## 2012-12-01 NOTE — Assessment & Plan Note (Signed)
Will treat for strep and staph with doxy She will take norco for pain as needed I await the clx results She will RTC in 2 days for packing removal She was given pt ed material about i and d

## 2012-12-03 ENCOUNTER — Ambulatory Visit (INDEPENDENT_AMBULATORY_CARE_PROVIDER_SITE_OTHER): Payer: Managed Care, Other (non HMO) | Admitting: Internal Medicine

## 2012-12-03 ENCOUNTER — Encounter: Payer: Self-pay | Admitting: Internal Medicine

## 2012-12-03 VITALS — BP 108/78 | HR 64 | Temp 97.6°F | Resp 16

## 2012-12-03 DIAGNOSIS — L0390 Cellulitis, unspecified: Secondary | ICD-10-CM

## 2012-12-03 DIAGNOSIS — L0291 Cutaneous abscess, unspecified: Secondary | ICD-10-CM

## 2012-12-03 NOTE — Patient Instructions (Signed)
Hidradenitis Suppurativa, Sweat Gland Abscess °Hidradenitis suppurativa is a long lasting (chronic), uncommon disease of the sweat glands. With this, boil-like lumps and scarring develop in the groin, some times under the arms (axillae), and under the breasts. It may also uncommonly occur behind the ears, in the crease of the buttocks, and around the genitals.  °CAUSES  °The cause is from a blocking of the sweat glands. They then become infected. It may cause drainage and odor. It is not contagious. So it cannot be given to someone else. It most often shows up in puberty (about 10 to 42 years of age). But it may happen much later. It is similar to acne which is a disease of the sweat glands. This condition is slightly more common in African-Americans and women. °SYMPTOMS  °· Hidradenitis usually starts as one or more red, tender, swellings in the groin or under the arms (axilla). °· Over a period of hours to days the lesions get larger. They often open to the skin surface, draining clear to yellow-colored fluid. °· The infected area heals with scarring. °DIAGNOSIS  °Your caregiver makes this diagnosis by looking at you. Sometimes cultures (growing germs on plates in the lab) may be taken. This is to see what germ (bacterium) is causing the infection.  °TREATMENT  °· Topical germ killing medicine applied to the skin (antibiotics) are the treatment of choice. Antibiotics taken by mouth (systemic) are sometimes needed when the condition is getting worse or is severe. °· Avoid tight-fitting clothing which traps moisture in. °· Dirt does not cause hidradenitis and it is not caused by poor hygiene. °· Involved areas should be cleaned daily using an antibacterial soap. Some patients find that the liquid form of Lever 2000®, applied to the involved areas as a lotion after bathing, can help reduce the odor related to this condition. °· Sometimes surgery is needed to drain infected areas or remove scarred tissue. Removal of  large amounts of tissue is used only in severe cases. °· Birth control pills may be helpful. °· Oral retinoids (vitamin A derivatives) for 6 to 12 months which are effective for acne may also help this condition. °· Weight loss will improve but not cure hidradenitis. It is made worse by being overweight. But the condition is not caused by being overweight. °· This condition is more common in people who have had acne. °· It may become worse under stress. °There is no medical cure for hidradenitis. It can be controlled, but not cured. The condition usually continues for years with periods of getting worse and getting better (remission). °Document Released: 11/06/2003 Document Revised: 06/16/2011 Document Reviewed: 11/22/2007 °Exit[employer]® Patient Information ©2014 [employer]. ° °

## 2012-12-04 LAB — WOUND CULTURE: Gram Stain: NONE SEEN

## 2012-12-05 ENCOUNTER — Encounter: Payer: Self-pay | Admitting: Internal Medicine

## 2012-12-06 ENCOUNTER — Encounter: Payer: Self-pay | Admitting: Internal Medicine

## 2012-12-06 NOTE — Progress Notes (Signed)
  Subjective:    Patient ID: Barbara Perry, female    DOB: July 19, 1970, 42 y.o.   MRN: [id number]  Wound Check She was originally treated 2 to 3 days ago. Previous treatment included I&D of abscess and oral antibiotics. Her temperature was unmeasured prior to arrival. There has been no drainage from the wound. The redness has improved. The swelling has improved. The pain has improved. She has no difficulty moving the affected extremity or digit.      Review of Systems  Constitutional: Negative.  Negative for fever, chills, diaphoresis, appetite change and fatigue.  HENT: Negative.   Eyes: Negative.   Respiratory: Negative.   Cardiovascular: Negative.   Gastrointestinal: Negative.  Negative for nausea, vomiting, abdominal pain, diarrhea and constipation.  Endocrine: Negative.   Genitourinary: Negative.   Skin: Negative.   Allergic/Immunologic: Negative.   Neurological: Negative.   Hematological: Negative.  Negative for adenopathy. Does not bruise/bleed easily.  Psychiatric/Behavioral: Negative.        Objective:   Physical Exam  Vitals reviewed. Constitutional: She is oriented to person, place, and time. She appears well-developed and well-nourished. No distress.  Eyes: Conjunctivae are normal. Left eye exhibits no discharge. No scleral icterus.  Pulmonary/Chest: Effort normal and breath sounds normal. No respiratory distress. She has no wheezes. She has no rales. She exhibits no tenderness.  Abdominal: Soft. Bowel sounds are normal. She exhibits no distension and no mass. There is no tenderness. There is no rebound and no guarding.  Musculoskeletal: She exhibits no edema and no tenderness.  Neurological: She is oriented to person, place, and time.  Skin: Skin is warm and dry. No rash noted. She is not diaphoretic. No erythema. No pallor.  The iodoform packing is removed from the 2 sights in the right axilla that underwent I and D, there is no additional exudate and the cavities  are empty. The surrounding area show that papules and pustules are resolving, there is no area of induration/ttp/warmth/fluctuance.          Assessment & Plan:

## 2012-12-06 NOTE — Assessment & Plan Note (Signed)
The Clx is + for MRSA, she will continue doxycycline

## 2012-12-27 ENCOUNTER — Encounter: Payer: Self-pay | Admitting: Internal Medicine

## 2012-12-27 ENCOUNTER — Ambulatory Visit (INDEPENDENT_AMBULATORY_CARE_PROVIDER_SITE_OTHER): Payer: Managed Care, Other (non HMO) | Admitting: Internal Medicine

## 2012-12-27 VITALS — BP 116/86 | HR 71 | Temp 97.8°F | Resp 16 | Wt 214.0 lb

## 2012-12-27 DIAGNOSIS — Z23 Encounter for immunization: Secondary | ICD-10-CM

## 2012-12-27 DIAGNOSIS — Z22322 Carrier or suspected carrier of Methicillin resistant Staphylococcus aureus: Secondary | ICD-10-CM

## 2012-12-27 NOTE — Assessment & Plan Note (Signed)
Previous area of infection has resolved Will observe for now

## 2012-12-27 NOTE — Progress Notes (Signed)
  Subjective:    Patient ID: Barbara Perry, female    DOB: [birth year], 42 y.o.   MRN: [id number]  Wound Check She was originally treated more than 14 days ago. Previous treatment included oral antibiotics. There has been no drainage from the wound. There is no redness present. There is no swelling present. The pain has no pain. She has no difficulty moving the affected extremity or digit.      Review of Systems  All other systems reviewed and are negative.       Objective:   Physical Exam  Vitals reviewed. Constitutional: She is oriented to person, place, and time. She appears well-developed and well-nourished. No distress.  HENT:  Head: Normocephalic and atraumatic.  Mouth/Throat: Oropharynx is clear and moist. No oropharyngeal exudate.  Eyes: Conjunctivae are normal. Right eye exhibits no discharge. Left eye exhibits no discharge. No scleral icterus.  Neck: Normal range of motion. Neck supple. No JVD present. No tracheal deviation present. No thyromegaly present.  Cardiovascular: Normal rate, regular rhythm, normal heart sounds and intact distal pulses.  Exam reveals no gallop and no friction rub.   No murmur heard. Pulmonary/Chest: Effort normal and breath sounds normal. No stridor. No respiratory distress. She has no wheezes. She has no rales. She exhibits no tenderness.    Abdominal: Soft. Bowel sounds are normal. She exhibits no distension and no mass. There is no tenderness. There is no rebound and no guarding.  Musculoskeletal: She exhibits no edema and no tenderness.  Lymphadenopathy:    She has no cervical adenopathy.  Neurological: She is oriented to person, place, and time.  Skin: Skin is warm and dry. No rash noted. She is not diaphoretic. No erythema. No pallor.     Lab Results  Component Value Date   WBC 5.1 09/08/2012   HGB 12.3 09/08/2012   HCT 38.0 09/08/2012   PLT 266 09/08/2012   GLUCOSE 86 07/22/2012   CHOL 154 07/22/2012   TRIG 47.0 07/22/2012   HDL 46.90  07/22/2012   LDLCALC 98 07/22/2012   ALT 16 07/22/2012   AST 16 07/22/2012   NA 138 07/22/2012   K 4.2 07/22/2012   CL 109 07/22/2012   CREATININE 0.5 07/22/2012   BUN 9 07/22/2012   CO2 23 07/22/2012   TSH 2.493 09/08/2012       Assessment & Plan:

## 2012-12-27 NOTE — Patient Instructions (Signed)
MRSA Overview  MRSA stands for methicillin-resistant Staphylococcus aureus. It is a type of bacteria that is resistant to some common antibiotics. It can cause infections in the skin and many other places in the body. Staphylococcus aureus, often called "staph," is a bacteria that normally lives on the skin or in the nose. Staph on the surface of the skin or in the nose does not cause problems. However, if the staph enters the body through a cut, wound, or break in the skin, an infection can happen.  Up until recently, infections with the MRSA type of staph mainly occurred in hospitals and other healthcare settings. There are now increasing problems with MRSA infections in the community as well. Infections with MRSA may be very serious or even life-threatening. Most MRSA infections are acquired in one of two ways:  · Healthcare-associated MRSA (HA-MRSA)  · This can be acquired by people in any healthcare setting. MRSA can be a big problem for hospitalized people, people in nursing homes, people in rehabilitation facilities, people with weakened immune systems, dialysis patients, and those who have had surgery.  · Community-associated MRSA (CA-MRSA)  · Community spread of MRSA is becoming more common. It is known to spread in crowded settings, in jails and prisons, and in situations where there is close skin-to-skin contact, such as during sporting events or in locker rooms. MRSA can be spread through shared items, such as children's toys, razors, towels, or sports equipment.  CAUSES   All staph, including MRSA, are normally harmless unless they enter the body through a scratch, cut, or wound, such as with surgery. All staph, including MRSA, can be spread from person-to-person by touching contaminated objects or through direct contact.  SPECIAL GROUPS  MRSA can present problems for special groups of people. Some of these groups include:  · Breastfeeding women.  · The most common problem is MRSA infection of the  breast (mastitis). There is evidence that MRSA can be passed to an infant from infected breast milk. Your caregiver may recommend that you stop breastfeeding until the mastitis is under control.  · If you are breastfeeding and have a MRSA infection in a place other than the breast, you may usually continue breastfeeding while under treatment. If taking antibiotics, ask your caregiver if it is safe to continue breastfeeding while taking your prescribed medicines.  · Neonates (babies from birth to 1 month old) and infants (babies from 1 month to 1 year old).  · There is evidence that MRSA can be passed to a newborn at birth if the mother has MRSA on the skin, in or around the birth canal, or an infection in the uterus, cervix, or vagina. MRSA infection can have the same appearance as a normal newborn or infant rash or several other skin infections. This can make it hard to diagnose MRSA.  · Immune compromised people.  · If you have an immune system problem, you may have a higher chance of developing a MRSA infection.  · People after any type of surgery.  · Staph in general, including MRSA, is the most common cause of infections occurring at the site of recent surgery.  · People on long-term steroid medicines.  · These kinds of medicines can lower your resistance to infection. This can increase your chance of getting MRSA.  · People who have had frequent hospitalizations, live in nursing homes or other residential care facilities, have venous or urinary catheters, or have taken multiple courses of antibiotic therapy for any reason.    DIAGNOSIS   Diagnosis of MRSA is done by cultures of fluid samples that may come from:  · Swabs taken from cuts or wounds in infected areas.  · Nasal swabs.  · Saliva or deep cough specimens from the lungs (sputum).  · Urine.  · Blood.  Many people are "colonized" with MRSA but have no signs of infection. This means that people carry the MRSA germ on their skin or in their nose and may  never develop MRSA infection.   TREATMENT   Treatment varies and is based on how serious, how deep, or how extensive the infection is. For example:  · Some skin infections, such as a small boil or abscess, may be treated by draining yellowish-white fluid (pus) from the site of the infection.  · Deeper or more widespread soft tissue infections are usually treated with surgery to drain pus and with antibiotic medicine given by vein or by mouth. This may be recommended even if you are pregnant.  · Serious infections may require a hospital stay.  If antibiotics are given, they may be needed for several weeks.  PREVENTION   Because many people are colonized with staph, including MRSA, preventing the spread of the bacteria from person-to-person is most important. The best way to prevent the spread of bacteria and other germs is through proper hand washing or by using alcohol-based hand disinfectants. The following are other ways to help prevent MRSA infection within the hospital and community settings.   · Healthcare settings:  · Strict hand washing or hand disinfection procedures need to be followed before and after touching every patient.  · Patients infected with MRSA are placed in isolation to prevent the spread of the bacteria.  · Healthcare workers need to wear disposable gowns and gloves when touching or caring for patients infected with MRSA. Visitors may also be asked to wear a gown and gloves.  · Hospital surfaces need to be disinfected frequently.  · Community settings:  · Wash your hands frequently with soap and water for at least 15 seconds. Otherwise, use alcohol-based hand disinfectants when soap and water is not available.  · Make sure people who live with you wash their hands often, too.  · Do not share personal items. For example, avoid sharing razors and other personal hygiene items, towels, clothing, and athletic equipment.  · Wash and dry your clothes and bedding at the warmest temperatures  recommended on the labels.  · Keep wounds covered. Pus from infected sores may contain MRSA and other bacteria. Keep cuts and abrasions clean and covered with germ-free (sterile), dry bandages until they are healed.  · If you have a wound that appears infected, ask your caregiver if a culture for MRSA and other bacteria should be done.  · If you are breastfeeding, talk to your caregiver about MRSA. You may be asked to temporarily stop breastfeeding.  HOME CARE INSTRUCTIONS   · Take your antibiotics as directed. Finish them even if you start to feel better.  · Avoid close contact with those around you as much as possible. Do not use towels, razors, toothbrushes, bedding, or other items that will be used by others.  · To fight the infection, follow your caregiver's instructions for wound care. Wash your hands before and after changing your bandages.  · If you have an intravascular device, such as a catheter, make sure you know how to care for it.  · Be sure to tell any healthcare providers that you have MRSA   so they are aware of your infection.  SEEK IMMEDIATE MEDICAL CARE IF:   · The infection appears to be getting worse. Signs include:  · Increased warmth, redness, or tenderness around the wound site.  · A red line that extends from the infection site.  · A dark color in the area around the infection.  · Wound drainage that is tan, yellow, or green.  · A bad smell coming from the wound.  · You feel sick to your stomach (nauseous) and throw up (vomit) or cannot keep medicine down.  · You have a fever.  · Your baby is older than 3 months with a rectal temperature of 102° F (38.9° C) or higher.  · Your baby is 3 months old or younger with a rectal temperature of 100.4° F (38° C) or higher.  · You have difficulty breathing.  MAKE SURE YOU:   · Understand these instructions.  · Will watch your condition.  · Will get help right away if you are not doing well or get worse.  Document Released: 03/24/2005 Document Revised:  06/16/2011 Document Reviewed: 06/26/2010  [employer]® Patient Information ©2014 [employer].

## 2012-12-31 ENCOUNTER — Encounter: Payer: Self-pay | Admitting: Internal Medicine

## 2012-12-31 ENCOUNTER — Ambulatory Visit (INDEPENDENT_AMBULATORY_CARE_PROVIDER_SITE_OTHER): Payer: Managed Care, Other (non HMO) | Admitting: Internal Medicine

## 2012-12-31 ENCOUNTER — Telehealth: Payer: Self-pay | Admitting: Internal Medicine

## 2012-12-31 VITALS — BP 112/72 | HR 67 | Temp 98.7°F

## 2012-12-31 DIAGNOSIS — IMO0002 Reserved for concepts with insufficient information to code with codable children: Secondary | ICD-10-CM

## 2012-12-31 DIAGNOSIS — L732 Hidradenitis suppurativa: Secondary | ICD-10-CM

## 2012-12-31 DIAGNOSIS — E669 Obesity, unspecified: Secondary | ICD-10-CM

## 2012-12-31 DIAGNOSIS — L02411 Cutaneous abscess of right axilla: Secondary | ICD-10-CM

## 2012-12-31 MED ORDER — SULFAMETHOXAZOLE-TRIMETHOPRIM 800-160 MG PO TABS: 1.0000 | ORAL_TABLET | Freq: Two times a day (BID) | ORAL | Status: DC

## 2012-12-31 NOTE — Patient Instructions (Signed)
It was good to see you today. Recurrent HS flare (abscess) as discussed today Treat with different antibiotics rather than drainage at this time - septra 2x/day x 10days - Your prescription(s) have been submitted to your pharmacy. Please take as directed and contact our office if you believe you are having problem(s) with the medication(s). Continue warm compress (5-81min 3x/day) to painful boil area under arm -  Alternate between ibuprofen and tylenol for aches, pain and fever symptoms as discussed Call if pain, redness or other issues become worse rather than improved for repeat drainage as needed

## 2012-12-31 NOTE — Telephone Encounter (Signed)
Patient Information:  Caller Name: [place]  Phone: [phone]  Patient: Barbara Perry, Barbara Perry  Gender: Female  DOB: [birth year]  Age: 42 Years  PCP: Sanda Linger (Adults only)  Pregnant: No  Office Follow Up:  Does the office need to follow up with this patient?: No  Instructions For The Office: N/A  RN Note:  In past, squeezed the boils to express pus but has not attempted it this time. Try warm compresses intermittenly for 10 mintues 5-6 X/day.  Symptoms  Reason For Call & Symptoms: Reports a new boil in right auxilla. History of MRSA.  Seen 12/01/12, 12/03/12 (I&D), and 12/27/12.  MD said might need another antibiotic if flared up again. Has nickel sized area of redness with pain; no drainage.  Moving right arm per normal.  Reviewed Health History In EMR: Yes  Reviewed Medications In EMR: Yes  Reviewed Allergies In EMR: Yes  Reviewed Surgeries / Procedures: Yes  Date of Onset of Symptoms: 12/31/2012 OB / GYN:  LMP: 12/27/2012  Guideline(s) Used:  Skin Lesion - Moles or Growths  Disposition Per Guideline:   See Today in Office  Reason For Disposition Reached:   Looks like a boil, infected sore, or deep ulcer  Advice Given:  Call Back If:  Fever or pain occurs  You become worse.  Patient Will Follow Care Advice:  YES  Appointment Scheduled:  12/31/2012 13:00:00 Appointment Scheduled Provider:  Rene Paci (Adults only)

## 2012-12-31 NOTE — Assessment & Plan Note (Signed)
Wt Readings from Last 3 Encounters:  12/27/12 214 lb (97.07 kg)  09/08/12 199 lb 4.8 oz (90.402 kg)  09/03/12 201 lb (91.173 kg)   The patient is asked to make an attempt to improve diet and exercise patterns to aid in medical management of this problem.

## 2012-12-31 NOTE — Progress Notes (Signed)
  Subjective:    Patient ID: Barbara Perry, female    DOB: Jun 11, 1970, 42 y.o.   MRN: [id number]  HPI Here for recurrent abscess under right armpit History of recent complications of same reviewed including I&D of at bedtime by primary care provider Off antibiotics for last 4 days -doxy previously prescribed Current symptoms began less than 24 hours ago ?Drain versus antibiotics only therapy at this time  Past Medical History  Diagnosis Date  . GERD (gastroesophageal reflux disease)   . Chronic low back pain 2010  . Headache, chronic daily 2011    Review of Systems  Constitutional: Negative for fever, diaphoresis and fatigue.  Skin: Positive for color change. Negative for rash and wound.       Objective:   Physical Exam  BP 112/72  Pulse 67  Temp(Src) 98.7 F (37.1 C) (Oral)  SpO2 98%  LMP 11/03/2012 Wt Readings from Last 3 Encounters:  12/27/12 214 lb (97.07 kg)  09/08/12 199 lb 4.8 oz (90.402 kg)  09/03/12 201 lb (91.173 kg)   Constitutional: She is obese, but appears well-developed and well-nourished. No distress. nontoxic Neck: Normal range of motion. Neck supple. No JVD present. No thyromegaly present.  Cardiovascular: Normal rate, regular rhythm and normal heart sounds.  No murmur heard. No BLE edema. Pulmonary/Chest: Effort normal and breath sounds normal. No respiratory distress. She has no wheezes.   Skin: right axilla with several healing scarred areas from prior at bedtime abscess, currently 1 small abscess (6mm) near surface, minimally associated erythema with tenderness to palpation, no surrounding cellulitis. No ulceration or draining tracts. Remaining skin is warm and dry. No rash noted.  Psychiatric: She has a normal mood and affect. Her behavior is normal. Judgment and thought content normal.   . 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

## 2013-04-28 ENCOUNTER — Ambulatory Visit: Payer: Managed Care, Other (non HMO) | Admitting: Internal Medicine

## 2013-08-25 ENCOUNTER — Ambulatory Visit (INDEPENDENT_AMBULATORY_CARE_PROVIDER_SITE_OTHER): Payer: Self-pay | Admitting: Obstetrics & Gynecology

## 2013-08-25 ENCOUNTER — Encounter: Payer: Self-pay | Admitting: Obstetrics & Gynecology

## 2013-08-25 ENCOUNTER — Other Ambulatory Visit (HOSPITAL_COMMUNITY)
Admission: RE | Admit: 2013-08-25 | Discharge: 2013-08-25 | Disposition: A | Discharge status: Home / Self Care | Payer: Managed Care, Other (non HMO) | Source: Ambulatory Visit | Attending: Obstetrics & Gynecology | Admitting: Obstetrics & Gynecology

## 2013-08-25 VITALS — BP 140/89 | HR 75 | Temp 98.1°F | Ht 64.0 in | Wt 224.6 lb

## 2013-08-25 DIAGNOSIS — N949 Unspecified condition associated with female genital organs and menstrual cycle: Secondary | ICD-10-CM | POA: Insufficient documentation

## 2013-08-25 DIAGNOSIS — N925 Other specified irregular menstruation: Secondary | ICD-10-CM | POA: Insufficient documentation

## 2013-08-25 DIAGNOSIS — N938 Other specified abnormal uterine and vaginal bleeding: Secondary | ICD-10-CM | POA: Insufficient documentation

## 2013-08-25 DIAGNOSIS — Z01812 Encounter for preprocedural laboratory examination: Secondary | ICD-10-CM

## 2013-08-25 LAB — POCT PREGNANCY, URINE: PREG TEST UR: NEGATIVE

## 2013-08-25 LAB — CBC
HEMATOCRIT: 36.8 % (ref 36.0–46.0)
HEMOGLOBIN: 12.3 g/dL (ref 12.0–15.0)
MCH: 26.9 pg (ref 26.0–34.0)
MCHC: 33.4 g/dL (ref 30.0–36.0)
MCV: 80.3 fL (ref 78.0–100.0)
Platelets: 262 10*3/uL (ref 150–400)
RBC: 4.58 MIL/uL (ref 3.87–5.11)
RDW: 13.6 % (ref 11.5–15.5)
WBC: 7.3 10*3/uL (ref 4.0–10.5)

## 2013-08-25 MED ORDER — MEGESTROL ACETATE 20 MG PO TABS: 40.0000 mg | ORAL_TABLET | Freq: Every day | ORAL | Status: DC

## 2013-08-25 NOTE — Patient Instructions (Signed)
Endometrial Biopsy, Care After Refer to this sheet in the next few weeks. These instructions provide you with information on caring for yourself after your procedure. Your health care provider may also give you more specific instructions. Your treatment has been planned according to current medical practices, but problems sometimes occur. Call your health care provider if you have any problems or questions after your procedure. WHAT TO EXPECT AFTER THE PROCEDURE After your procedure, it is typical to have the following:  You may have mild cramping and a small amount of vaginal bleeding for a few days after the procedure. This is normal. HOME CARE INSTRUCTIONS  Only take over-the-counter or prescription medicine as directed by your health care provider.  Do not douche, use tampons, or have sexual intercourse until your health care provider approves.  Follow your health care provider's instructions regarding any activity restrictions, such as strenuous exercise or heavy lifting. SEEK MEDICAL CARE IF:  You have heavy bleeding or bleeding longer than 2 days after the procedure.  You have bad smelling drainage from your vagina.  You have a fever and chills.  Youhave severe lower stomach (abdominal) pain. SEEK IMMEDIATE MEDICAL CARE IF:  You have severe cramps in your stomach or back.  You pass large blood clots.  Your bleeding increases.  You become weak or lightheaded, or you pass out. Document Released: 01/12/2013 Document Reviewed: 09/08/2012 [facility] Patient Information 2014 [place]. Endometrial Biopsy Endometrial biopsy is a procedure in which a tissue sample is taken from inside the uterus. The tissue sample is then looked at under a microscope to see if the tissue is normal or abnormal. The endometrium is the lining of the uterus. This procedure helps determine where you are in your menstrual cycle and how hormone levels are affecting the lining of the uterus. This  procedure may also be used to evaluate uterine bleeding or to diagnose endometrial cancer, tuberculosis, polyps, or inflammatory conditions.  LET [facility] CARE PROVIDER KNOW ABOUT:  Any allergies you have.  All medicines you are taking, including vitamins, herbs, eye drops, creams, and over-the-counter medicines.  Previous problems you or members of your family have had with the use of anesthetics.  Any blood disorders you have.  Previous surgeries you have had.  Medical conditions you have.  Possibility of pregnancy. RISKS AND COMPLICATIONS Generally, this is a safe procedure. However, as with any procedure, complications can occur. Possible complications include:  Bleeding.  Pelvic infection.  Puncture of the uterine wall with the biopsy device (rare). BEFORE THE PROCEDURE   Keep a record of your menstrual cycles as directed by your health care provider. You may need to schedule your procedure for a specific time in your cycle.  You may want to bring a sanitary pad to wear home after the procedure.  Arrange for someone to drive you home after the procedure if you will be given a medicine to help you relax (sedative). PROCEDURE   You may be given a sedative to relax you.  You will lie on an exam table with your feet and legs supported as in a pelvic exam.  Your health care provider will insert an instrument (speculum) into your vagina to see your cervix.  Your cervix will be cleansed with an antiseptic solution. A medicine (local anesthetic) will be used to numb the cervix.  A forceps instrument (tenaculum) will be used to hold your cervix steady for the biopsy.  A thin, rodlike instrument (uterine sound) will be inserted through  your cervix to determine the length of your uterus and the location where the biopsy sample will be removed.  A thin, flexible tube (catheter) will be inserted through your cervix and into the uterus. The catheter is used to collect the  biopsy sample from your endometrial tissue.  The catheter and speculum will then be removed, and the tissue sample will be sent to a lab for examination. AFTER THE PROCEDURE  You will rest in a recovery area until you are ready to go home.  You may have mild cramping and a small amount of vaginal bleeding for a few days after the procedure. This is normal.  Make sure you find out how to get your test results. Document Released: 07/25/2004 Document Revised: 11/24/2012 Document Reviewed: 09/08/2012 [facility] Patient Information 2014 [place], [place]. Endometrial Ablation Endometrial ablation removes the lining of the uterus (endometrium). It is usually a same-day, outpatient treatment. Ablation helps avoid major surgery, such as surgery to remove the cervix and uterus (hysterectomy). After endometrial ablation, you will have little or no menstrual bleeding and may not be able to have children. However, if you are premenopausal, you will need to use a reliable method of birth control following the procedure because of the small chance that pregnancy can occur. There are different reasons to have this procedure, which include:  Heavy periods.  Bleeding that is causing anemia.  Irregular bleeding.  Bleeding fibroids on the lining inside the uterus if they are smaller than 3 centimeters. This procedure should not be done if:  You want children in the future.  You have severe cramps with your menstrual period.  You have precancerous or cancerous cells in your uterus.  You were recently pregnant.  You have gone through menopause.  You have had major surgery on the uterus, such as a cesarean delivery. LET [facility]ER KNOW ABOUT:  Any allergies you have.  All medicines you are taking, including vitamins, herbs, eye drops, creams, and over-the-counter medicines.  Previous problems you or members of your family have had with the use of anesthetics.  Any blood disorders you  have.  Previous surgeries you have had.  Medical conditions you have. RISKS AND COMPLICATIONS  Generally, this is a safe procedure. However, as with any procedure, complications can occur. Possible complications include:  Perforation of the uterus.  Bleeding.  Infection of the uterus, bladder, or vagina.  Injury to surrounding organs.  An air bubble to the lung (air embolus).  Pregnancy following the procedure.  Failure of the procedure to help the problem, requiring hysterectomy.  Decreased ability to diagnose cancer in the lining of the uterus. BEFORE THE PROCEDURE  The lining of the uterus must be tested to make sure there is no pre-cancerous or cancer cells present.  An ultrasound may be performed to look at the size of the uterus and to check for abnormalities.  Medicines may be given to thin the lining of the uterus. PROCEDURE  During the procedure, your health care provider will use a tool called a resectoscope to help see inside your uterus. There are different ways to remove the lining of your uterus.   Radiofrequency  This method uses a radiofrequency-alternating electric current to remove the lining of the uterus.  Cryotherapy This method uses extreme cold to freeze the lining of the uterus.  Heated-Free Liquid  This method uses heated salt (saline) solution to remove the lining of the uterus.  Microwave This method uses high-energy microwaves to heat up  the lining of the uterus to remove it.  Thermal balloon  This method involves inserting a catheter with a balloon tip into the uterus. The balloon tip is filled with heated fluid to remove the lining of the uterus. AFTER THE PROCEDURE  After your procedure, do not have sexual intercourse or insert anything into your vagina until permitted by your health care provider. After the procedure, you may experience:  Cramps.  Vaginal discharge.  Frequent urination. Document Released: 02/01/2004 Document Revised:  11/24/2012 Document Reviewed: 08/25/2012 [facility] Patient Information 2014 [place], [place].

## 2013-08-25 NOTE — Progress Notes (Signed)
Subjective:     Patient ID: Barbara StagerMichelle D Vanacker, female   DOB: February 08, 1971, 43 y.o.   MRN: [id number]  HPI Pt reports irregular cycles for ~1year and now has had 4 months of bleeding which stopped about 2/3 days ago.   Pt denies constitutional sx.  No weight loss.  No mood changes or hot flushes   Review of Systems     Objective:   Physical Exam BP 140/89  Pulse 75  Temp(Src) 98.1 F (36.7 C) (Oral)  Ht 5\' 4"  (1.626 m)  Wt 224 lb 9.6 oz (101.878 kg)  BMI 38.53 kg/m2  LMP 04/07/2013 Pt in NAD Abd: obese, NT; ND  The indications for endometrial biopsy were reviewed.   Risks of the biopsy including cramping, bleeding, infection, uterine perforation, inadequate specimen and need for additional procedures  were discussed. The patient states she understands and agrees to undergo procedure today. Consent was signed. Time out was performed. Urine HCG was negative. A sterile speculum was placed in the patient's vagina and the cervix was prepped with Betadine. A single-toothed tenaculum was placed on the anterior lip of the cervix to stabilize it. The 3 mm pipelle was introduced into the endometrial cavity without difficulty to a depth of 10cm, and a moderate amount of tissue was obtained and sent to pathology. The instruments were removed from the patient's vagina. Minimal bleeding from the cervix was noted. The patient tolerated the procedure well.  09/2012 Adequacy Reason Satisfactory for evaluation, endocervical/transformation zone component PRESENT. Diagnosis NEGATIVE FOR INTRAEPITHELIAL LESIONS OR MALIGNANCY. TANYA SPEED Cytotechnologist Electronic Signature (Case signed 09/10/2012) Source CervicoVaginal Pap [ThinPrep Imaged] Ancillary Testing Chlamydia T. CT: Negative Completed by JB on 2012-09-09 HPV High Risk High Risk HPV: NOT DETECTED Completed by JB on 2012-09-09 Neisseria G. NG: Negative     Assessment:     DUB     Plan:     Lab: TSH; CBC Pelvic sono F/u endo  bx Megace 40mg  q day F/u in 6 weeks or sooner prn Routine post-procedure instructions were given to the patient. The patient will follow up to review the results and for further management.

## 2013-08-26 LAB — TSH: TSH: 4.281 u[IU]/mL (ref 0.350–4.500)

## 2013-08-26 LAB — FOLLICLE STIMULATING HORMONE: FSH: 2.7 m[IU]/mL

## 2013-09-01 ENCOUNTER — Telehealth: Payer: Self-pay | Admitting: *Deleted

## 2013-09-01 NOTE — Telephone Encounter (Signed)
Called patient and gave results and plan from Dr. Erin Fulling. Pt verbalizes understanding, no further questions.

## 2013-09-01 NOTE — Telephone Encounter (Signed)
Message copied by Dorothyann Peng on Thu Sep 01, 2013 11:46 AM ------      Message from: Willodean Rosenthal      Created: Thu Sep 01, 2013 10:45 AM       Please call pt.  Her endo bx was normal. Pls ask her to f/u in 3 months or sooner prn. (not 6 weeks) and keep taking the Megace.            Thx      clh-S   ------

## 2013-09-05 ENCOUNTER — Ambulatory Visit (HOSPITAL_COMMUNITY)
Admission: RE | Admit: 2013-09-05 | Discharge: 2013-09-05 | Disposition: A | Discharge status: Home / Self Care | Payer: Managed Care, Other (non HMO) | Source: Ambulatory Visit | Attending: Obstetrics & Gynecology | Admitting: Obstetrics & Gynecology

## 2013-09-05 ENCOUNTER — Ambulatory Visit (HOSPITAL_COMMUNITY): Payer: Managed Care, Other (non HMO)

## 2013-09-05 DIAGNOSIS — N949 Unspecified condition associated with female genital organs and menstrual cycle: Secondary | ICD-10-CM | POA: Insufficient documentation

## 2013-09-05 DIAGNOSIS — N938 Other specified abnormal uterine and vaginal bleeding: Secondary | ICD-10-CM | POA: Insufficient documentation

## 2013-09-29 ENCOUNTER — Ambulatory Visit: Payer: Managed Care, Other (non HMO) | Admitting: Obstetrics & Gynecology

## 2013-10-12 ENCOUNTER — Telehealth: Payer: Self-pay | Admitting: *Deleted

## 2013-10-12 NOTE — Telephone Encounter (Signed)
Patient states that the pills she was given are not working anymore for her bleeding. Per Dr. Erin FullingHarraway Smith last note patient is to followup for management. Patient is scheduled for 11/28/13.Will route to Dr. Erin FullingHarraway Smith to see if there is anything else to do prior to her visit.

## 2013-10-17 ENCOUNTER — Telehealth: Payer: Self-pay | Admitting: Obstetrics & Gynecology

## 2013-10-17 NOTE — Telephone Encounter (Signed)
Pt c/o bleeding not responsive to the Megace.  She reports that they worked initially but, then she stopped them because she ran out.

## 2013-10-17 NOTE — Telephone Encounter (Signed)
She reports continued bleeding and wants definitive treatment. I reviewed endometrial ablation with the patient and she wants to proceed with that.  Patient desires surgical management with endometrial ablation.  The risks of surgery were discussed in detail with the patient including but not limited to: bleeding which may require transfusion or reoperation; infection which may require prolonged hospitalization or re-hospitalization and antibiotic therapy; injury to bowel, bladder, ureters and major vessels or other surrounding organs; need for additional procedures including laparotomy; thromboembolic phenomenon, incisional problems and other postoperative or anesthesia complications.  Patient was told that the likelihood that her condition and symptoms will be treated effectively with this surgical management was very high; the postoperative expectations were also discussed in detail. The patient also understands the alternative treatment options which were discussed in full. All questions were answered.  She was told that she will be contacted by our surgical scheduler regarding the time and date of her surgery; routine preoperative instructions of having nothing to eat or drink after midnight on the day prior to surgery and also coming to the hospital 1 1/2 hours prior to her time of surgery were also emphasized.  She was told she may be called for a preoperative appointment about a week prior to surgery and will be given further preoperative instructions at that visit. She was asked to look up info on this on the internet to review.

## 2013-10-18 NOTE — Telephone Encounter (Signed)
See encounter from Dr. Erin FullingHarraway Smith. She spoke with patient.

## 2013-10-21 ENCOUNTER — Telehealth: Payer: Self-pay | Admitting: *Deleted

## 2013-10-21 NOTE — Telephone Encounter (Signed)
Pt called clinic stating she has a question for [facility]Carolyn concerning her condition.  Contacted patient,  Pt reports that yesterday when she was removing her tampon she seen tissue on her tampon.  Pt states that she no longer sees any tissue/no pain/bleeding is decreasing.  Informed patient that if her symptoms change she may need to be evaluated in the MAU,  Pt verbalizes understanding.  Pt is scheduled for ablation 8/10.

## 2013-11-01 ENCOUNTER — Encounter (HOSPITAL_COMMUNITY): Payer: Self-pay | Admitting: Pharmacist

## 2013-11-14 ENCOUNTER — Ambulatory Visit (HOSPITAL_COMMUNITY): Payer: Medicaid Other | Admitting: Anesthesiology

## 2013-11-14 ENCOUNTER — Encounter (HOSPITAL_COMMUNITY): Payer: Medicaid Other | Admitting: Anesthesiology

## 2013-11-14 ENCOUNTER — Ambulatory Visit (HOSPITAL_COMMUNITY)
Admission: RE | Admit: 2013-11-14 | Discharge: 2013-11-14 | Disposition: A | Discharge status: Home / Self Care | Payer: Medicaid Other | Source: Ambulatory Visit | Attending: Obstetrics & Gynecology | Admitting: Obstetrics & Gynecology

## 2013-11-14 ENCOUNTER — Encounter (HOSPITAL_COMMUNITY)
Admission: RE | Disposition: A | Discharge status: Home / Self Care | Payer: Self-pay | Source: Ambulatory Visit | Attending: Obstetrics & Gynecology

## 2013-11-14 ENCOUNTER — Encounter (HOSPITAL_COMMUNITY): Payer: Self-pay | Admitting: *Deleted

## 2013-11-14 DIAGNOSIS — N925 Other specified irregular menstruation: Secondary | ICD-10-CM | POA: Diagnosis not present

## 2013-11-14 DIAGNOSIS — K219 Gastro-esophageal reflux disease without esophagitis: Secondary | ICD-10-CM | POA: Insufficient documentation

## 2013-11-14 DIAGNOSIS — N949 Unspecified condition associated with female genital organs and menstrual cycle: Secondary | ICD-10-CM | POA: Diagnosis not present

## 2013-11-14 DIAGNOSIS — D649 Anemia, unspecified: Secondary | ICD-10-CM | POA: Insufficient documentation

## 2013-11-14 DIAGNOSIS — N926 Irregular menstruation, unspecified: Secondary | ICD-10-CM | POA: Insufficient documentation

## 2013-11-14 DIAGNOSIS — N938 Other specified abnormal uterine and vaginal bleeding: Secondary | ICD-10-CM | POA: Diagnosis not present

## 2013-11-14 DIAGNOSIS — N840 Polyp of corpus uteri: Secondary | ICD-10-CM | POA: Diagnosis not present

## 2013-11-14 DIAGNOSIS — R51 Headache: Secondary | ICD-10-CM | POA: Insufficient documentation

## 2013-11-14 HISTORY — PX: DILITATION & CURRETTAGE/HYSTROSCOPY WITH NOVASURE ABLATION: SHX5568

## 2013-11-14 LAB — CBC
HCT: 38.2 % (ref 36.0–46.0)
Hemoglobin: 12.7 g/dL (ref 12.0–15.0)
MCH: 26.5 pg (ref 26.0–34.0)
MCHC: 33.2 g/dL (ref 30.0–36.0)
MCV: 79.7 fL (ref 78.0–100.0)
PLATELETS: 253 10*3/uL (ref 150–400)
RBC: 4.79 MIL/uL (ref 3.87–5.11)
RDW: 14.7 % (ref 11.5–15.5)
WBC: 6.6 10*3/uL (ref 4.0–10.5)

## 2013-11-14 LAB — PREGNANCY, URINE: Preg Test, Ur: NEGATIVE

## 2013-11-14 SURGERY — DILATATION & CURETTAGE/HYSTEROSCOPY WITH NOVASURE ABLATION
Anesthesia: Epidural | Site: Uterus

## 2013-11-14 MED ORDER — DEXAMETHASONE SODIUM PHOSPHATE 10 MG/ML IJ SOLN
INTRAMUSCULAR | Status: DC | PRN
  Administered 2013-11-14: 4 mg via INTRAVENOUS

## 2013-11-14 MED ORDER — KETOROLAC TROMETHAMINE 30 MG/ML IJ SOLN
INTRAMUSCULAR | Status: AC
  Filled 2013-11-14: qty 1

## 2013-11-14 MED ORDER — LACTATED RINGERS IV SOLN
INTRAVENOUS | Status: DC
  Administered 2013-11-14: 08:00:00 via INTRAVENOUS

## 2013-11-14 MED ORDER — LIDOCAINE HCL (CARDIAC) 20 MG/ML IV SOLN
INTRAVENOUS | Status: DC | PRN
  Administered 2013-11-14: 60 mg via INTRAVENOUS

## 2013-11-14 MED ORDER — FENTANYL CITRATE 0.05 MG/ML IJ SOLN
INTRAMUSCULAR | Status: AC
  Filled 2013-11-14: qty 2

## 2013-11-14 MED ORDER — KETOROLAC TROMETHAMINE 30 MG/ML IJ SOLN
INTRAMUSCULAR | Status: DC | PRN
  Administered 2013-11-14: 30 mg via INTRAVENOUS

## 2013-11-14 MED ORDER — DEXAMETHASONE SODIUM PHOSPHATE 10 MG/ML IJ SOLN
INTRAMUSCULAR | Status: AC
  Filled 2013-11-14: qty 1

## 2013-11-14 MED ORDER — MEPERIDINE HCL 25 MG/ML IJ SOLN: 6.2500 mg | INTRAMUSCULAR | Status: DC | PRN

## 2013-11-14 MED ORDER — LACTATED RINGERS IV SOLN
INTRAVENOUS | Status: DC | PRN
  Administered 2013-11-14: 3000 mL

## 2013-11-14 MED ORDER — BUPIVACAINE HCL (PF) 0.5 % IJ SOLN
INTRAMUSCULAR | Status: DC | PRN
  Administered 2013-11-14: 20 mL

## 2013-11-14 MED ORDER — SCOPOLAMINE 1 MG/3DAYS TD PT72
MEDICATED_PATCH | TRANSDERMAL | Status: AC
  Administered 2013-11-14: 1.5 mg via TRANSDERMAL
  Filled 2013-11-14: qty 1

## 2013-11-14 MED ORDER — FENTANYL CITRATE 0.05 MG/ML IJ SOLN
INTRAMUSCULAR | Status: DC | PRN
  Administered 2013-11-14 (×2): 50 ug via INTRAVENOUS

## 2013-11-14 MED ORDER — IBUPROFEN 600 MG PO TABS: 600.0000 mg | ORAL_TABLET | Freq: Four times a day (QID) | ORAL | Status: AC | PRN

## 2013-11-14 MED ORDER — PROPOFOL 10 MG/ML IV BOLUS
INTRAVENOUS | Status: DC | PRN
  Administered 2013-11-14: 150 mg via INTRAVENOUS

## 2013-11-14 MED ORDER — DEXAMETHASONE SODIUM PHOSPHATE 4 MG/ML IJ SOLN
INTRAMUSCULAR | Status: AC
  Filled 2013-11-14: qty 1

## 2013-11-14 MED ORDER — LACTATED RINGERS IV SOLN
INTRAVENOUS | Status: DC
  Administered 2013-11-14 (×2): via INTRAVENOUS

## 2013-11-14 MED ORDER — PROPOFOL 10 MG/ML IV EMUL
INTRAVENOUS | Status: AC
  Filled 2013-11-14: qty 20

## 2013-11-14 MED ORDER — MIDAZOLAM HCL 5 MG/5ML IJ SOLN
INTRAMUSCULAR | Status: DC | PRN
  Administered 2013-11-14: 2 mg via INTRAVENOUS

## 2013-11-14 MED ORDER — ONDANSETRON HCL 4 MG/2ML IJ SOLN
INTRAMUSCULAR | Status: DC | PRN
  Administered 2013-11-14: 4 mg via INTRAVENOUS

## 2013-11-14 MED ORDER — LIDOCAINE HCL (CARDIAC) 20 MG/ML IV SOLN
INTRAVENOUS | Status: AC
  Filled 2013-11-14: qty 5

## 2013-11-14 MED ORDER — SCOPOLAMINE 1 MG/3DAYS TD PT72
1.0000 | MEDICATED_PATCH | Freq: Once | TRANSDERMAL | Status: DC
  Administered 2013-11-14: 1.5 mg via TRANSDERMAL

## 2013-11-14 MED ORDER — ONDANSETRON HCL 4 MG/2ML IJ SOLN
INTRAMUSCULAR | Status: AC
  Filled 2013-11-14: qty 2

## 2013-11-14 MED ORDER — FENTANYL CITRATE 0.05 MG/ML IJ SOLN: 25.0000 ug | INTRAMUSCULAR | Status: DC | PRN

## 2013-11-14 MED ORDER — METOCLOPRAMIDE HCL 5 MG/ML IJ SOLN: 10.0000 mg | Freq: Once | INTRAMUSCULAR | Status: DC | PRN

## 2013-11-14 MED ORDER — BUPIVACAINE HCL (PF) 0.5 % IJ SOLN
INTRAMUSCULAR | Status: AC
  Filled 2013-11-14: qty 30

## 2013-11-14 MED ORDER — OXYCODONE-ACETAMINOPHEN 5-325 MG PO TABS: 1.0000 | ORAL_TABLET | Freq: Four times a day (QID) | ORAL | Status: DC | PRN

## 2013-11-14 MED ORDER — MIDAZOLAM HCL 2 MG/2ML IJ SOLN
INTRAMUSCULAR | Status: AC
  Filled 2013-11-14: qty 2

## 2013-11-14 SURGICAL SUPPLY — 17 items
ABLATOR ENDOMETRIAL BIPOLAR (ABLATOR) ×3 IMPLANT
CATH ROBINSON RED A/P 16FR (CATHETERS) ×3 IMPLANT
CLOTH BEACON ORANGE TIMEOUT ST (SAFETY) ×3 IMPLANT
CONTAINER PREFILL 10% NBF 60ML (FORM) ×3 IMPLANT
DRAPE HYSTEROSCOPY (DRAPE) ×3 IMPLANT
GLOVE BIO SURGEON STRL SZ7 (GLOVE) ×3 IMPLANT
GLOVE BIOGEL PI IND STRL 7.0 (GLOVE) ×1 IMPLANT
GLOVE BIOGEL PI INDICATOR 7.0 (GLOVE) ×2
GLOVE INDICATOR 7.0 STRL GRN (GLOVE) ×15 IMPLANT
GLOVE SURG SS PI 7.0 STRL IVOR (GLOVE) ×15 IMPLANT
GOWN STRL REUS W/TWL LRG LVL3 (GOWN DISPOSABLE) ×6 IMPLANT
PACK VAGINAL MINOR WOMEN LF (CUSTOM PROCEDURE TRAY) ×3 IMPLANT
PAD OB MATERNITY 4.3X12.25 (PERSONAL CARE ITEMS) ×3 IMPLANT
SET TUBING HYSTEROSCOPY 2 NDL (TUBING) ×3 IMPLANT
TOWEL OR 17X24 6PK STRL BLUE (TOWEL DISPOSABLE) ×6 IMPLANT
TUBE HYSTEROSCOPY W Y-CONNECT (TUBING) ×3 IMPLANT
WATER STERILE IRR 1000ML POUR (IV SOLUTION) ×3 IMPLANT

## 2013-11-14 NOTE — Anesthesia Preprocedure Evaluation (Signed)
Anesthesia Evaluation  Patient identified by MRN, date of birth, ID band Patient awake    Reviewed: Allergy & Precautions, H&P , NPO status , Patient's Chart, lab work & pertinent test results  Airway Mallampati: IV TM Distance: >3 FB Neck ROM: Full    Dental no notable dental hx. (+) Teeth Intact   Pulmonary neg pulmonary ROS,  breath sounds clear to auscultation  Pulmonary exam normal       Cardiovascular negative cardio ROS  Rhythm:Regular Rate:Normal     Neuro/Psych  Headaches, negative psych ROS   GI/Hepatic Neg liver ROS, GERD-  Medicated and Controlled,  Endo/Other  Obesity  Renal/GU negative Renal ROS  negative genitourinary   Musculoskeletal negative musculoskeletal ROS (+)   Abdominal (+) + obese,   Peds  Hematology  (+) anemia ,   Anesthesia Other Findings   Reproductive/Obstetrics DUB                           Anesthesia Physical Anesthesia Plan  ASA: II  Anesthesia Plan: Epidural   Post-op Pain Management:    Induction: Intravenous  Airway Management Planned: LMA  Additional Equipment:   Intra-op Plan:   Post-operative Plan: Extubation in OR  Informed Consent: I have reviewed the patients History and Physical, chart, labs and discussed the procedure including the risks, benefits and alternatives for the proposed anesthesia with the patient or authorized representative who has indicated his/her understanding and acceptance.   Dental advisory given  Plan Discussed with: CRNA, Anesthesiologist and Surgeon  Anesthesia Plan Comments:         Anesthesia Quick Evaluation

## 2013-11-14 NOTE — Brief Op Note (Signed)
11/14/2013  9:27 AM  PATIENT:  Barbara StagerMichelle D Perry  43 y.o. female  PRE-OPERATIVE DIAGNOSIS:  DYFUNCTIONAL UTERINE BLEEDING   POST-OPERATIVE DIAGNOSIS:  DYFUNCTIONAL UTERINE BLEEDING   PROCEDURE:  Procedure(s): ENDOMETRIAL ABLATION WITH NOVASURE with D and C hysteroscopy (N/A)  SURGEON:  Surgeon(s) and Role:    * Willodean Rosenthalarolyn Harraway-Smith, MD - Primary  ANESTHESIA:   general with paracervical block  EBL:  Total I/O In: 1000 [I.V.:1000] Out: 25 [Urine:25]  BLOOD ADMINISTERED:none  DRAINS: none   LOCAL MEDICATIONS USED:  MARCAINE     SPECIMEN:  Source of Specimen:  endometrium  DISPOSITION OF SPECIMEN:  PATHOLOGY  COUNTS:  YES  TOURNIQUET:  * No tourniquets in log *  DICTATION: .Note written in EPIC  PLAN OF CARE: Discharge to home after PACU  PATIENT DISPOSITION:  PACU - hemodynamically stable.   Delay start of Pharmacological VTE agent (>24hrs) due to surgical blood loss or risk of bleeding: not applicable

## 2013-11-14 NOTE — H&P (Signed)
  HPI Pt reports irregular cycles for ~1year and now has had 5 months of bleeding almost daily. Pt denies constitutional sx. No weight loss. No mood changes or hot flushes  Review of Systems  Objective:   Physical Exam BP 130/83  Pulse 82  Temp(Src) 99.1 F (37.3 C) (Oral)  Resp 16  Ht 5\' 4"  (1.626 m)  Wt 220 lb (99.791 kg)  BMI 37.74 kg/m2  SpO2 98%  Pt in NAD  Lungs: CTA CV: RRR Abd: obese, NT, ND CBC    Component Value Date/Time   WBC 6.6 11/14/2013 0737   RBC 4.79 11/14/2013 0737   HGB 12.7 11/14/2013 0737   HCT 38.2 11/14/2013 0737   PLT 253 11/14/2013 0737   MCV 79.7 11/14/2013 0737   MCH 26.5 11/14/2013 0737   MCHC 33.2 11/14/2013 0737   RDW 14.7 11/14/2013 0737   LYMPHSABS 1.3 09/03/2012 1004   MONOABS 0.4 09/03/2012 1004   EOSABS 0.1 09/03/2012 1004   BASOSABS 0.0 09/03/2012 1004     09/2012  Adequacy Reason  Satisfactory for evaluation, endocervical/transformation zone component PRESENT.  Diagnosis  NEGATIVE FOR INTRAEPITHELIAL LESIONS OR MALIGNANCY.  TANYA SPEED  Cytotechnologist Electronic Signature  (Case signed 09/10/2012)  Source  CervicoVaginal Pap [ThinPrep Imaged]  Ancillary Testing  Chlamydia T.  CT: Negative  Completed by JB on 2012-09-09  HPV High Risk  High Risk HPV: NOT DETECTED  Completed by JB on 2012-09-09  Neisseria G.  NG: Negative  Assessment:   DUB  Plan:    Patient desires surgical management with hysteroscopy with endometrial ablation.  The risks of surgery were discussed in detail with the patient including but not limited to: bleeding which may require transfusion or reoperation; infection which may require prolonged hospitalization or re-hospitalization and antibiotic therapy; injury to bowel, bladder, ureters and major vessels or other surrounding organs; need for additional procedures including laparotomy; thromboembolic phenomenon, incisional problems and other postoperative or anesthesia complications.  Patient was told that the  likelihood that her condition and symptoms will be treated effectively with this surgical management was very high; the postoperative expectations were also discussed in detail. The patient also understands the alternative treatment options which were discussed in full. All questions were answered.

## 2013-11-14 NOTE — Op Note (Signed)
11/14/2013  9:27 AM  PATIENT:  Barbara StagerMichelle D Perry  43 y.o. female  PRE-OPERATIVE DIAGNOSIS:  DYFUNCTIONAL UTERINE BLEEDING   POST-OPERATIVE DIAGNOSIS:  DYFUNCTIONAL UTERINE BLEEDING   PROCEDURE:  Procedure(s): ENDOMETRIAL ABLATION WITH NOVASURE with D and C hysteroscopy (N/A)  SURGEON:  Surgeon(s) and Role:    * Willodean Rosenthalarolyn Harraway-Smith, MD - Primary  ANESTHESIA:   general with paracervical block  EBL:  Total I/O In: 1000 [I.V.:1000] Out: 25 [Urine:25]  BLOOD ADMINISTERED:none  DRAINS: none   LOCAL MEDICATIONS USED:  MARCAINE     SPECIMEN:  Source of Specimen:  endometrium  DISPOSITION OF SPECIMEN:  PATHOLOGY  COUNTS:  YES  TOURNIQUET:  * No tourniquets in log *  DICTATION: .Note written in EPIC  PLAN OF CARE: Discharge to home after PACU  PATIENT DISPOSITION:  PACU - hemodynamically stable.   Delay start of Pharmacological VTE agent (>24hrs) due to surgical blood loss or risk of bleeding: not applicable  Operative findings: thickened endometrium greatest in the lower uterine segment.  The risks, benefits, and alternatives of surgery were explained, understood, and accepted. The consents were signed and all questions were answered. She was taken to the operating room and general anesthesia was applied without complication. She was placed in the dorsal lithotomy position and her vagina and abdomen were prepped and draped in the usual sterile fashion. A bimanual exam revealed a normal size and shape anteverted mobile uterus. Her adnexa were non-enlarged.   A bivalved speculum was placed in the patients' vagina and the anterior lip of the cervix was grasped with a single toothed tenaculum. A paracervical block was performed at 5 and 7 o'clock with 20cc of 0.5% Marcaine.   The endometrial cavity was sounded to 11cm and the endocervical length measured 4.5cm. A hysteroscope was inserted and the endometrium was noted to be thickened with several polyps and thickened endometrium  in the lower uterine segment.  The ostia on both sides were noted.  The scope was removed and a sharp currete was used to scape the lining of the uterus until a gritty texture was noted throughout.  Specimens were sent to pathology.  The NovaSure device was then inserted and seated using 6.5cm as the cavity length and 4.5cm as the cavity width.  The total activation time was 78 sec at a power of 161.  The hysteroscope was reinserted and an even burn pattern was noted to the fundus.  The single toothed tenaculum was removed at the end of the case and no bleeding was noted from the cervix.   The patient was extubated and taken to the recovery room in stable condition.  Sponge, lap and instrument counts were correct.  There were no complications. The fluid deficit was <40cc.  Pt to be discharged to home after the PACU on Motrin and Percocet.  Will f/u in 2 weeks in the office.    Barbara Perry, M.D., Barbara CoreFACOG

## 2013-11-14 NOTE — Transfer of Care (Signed)
Immediate Anesthesia Transfer of Care Note  Patient: Barbara StagerMichelle D Perry  Procedure(s) Performed: Procedure(s): ENDOMETRIAL ABLATION WITH NOVASURE with D and C hysteroscopy (N/A)  Patient Location: PACU  Anesthesia Type:General  Level of Consciousness: awake  Airway & Oxygen Therapy: Patient Spontanous Breathing and Patient connected to nasal cannula oxygen  Post-op Assessment: Report given to PACU RN and Post -op Vital signs reviewed and stable  Post vital signs: stable  Complications: No apparent anesthesia complications

## 2013-11-14 NOTE — Anesthesia Postprocedure Evaluation (Signed)
  Anesthesia Post-op Note  Patient: Drue StagerMichelle D [place]  Procedure(s) Performed: Procedure(s): ENDOMETRIAL ABLATION WITH NOVASURE with D and C hysteroscopy (N/A)  Patient Location: PACU  Anesthesia Type:General  Level of Consciousness: awake, alert  and oriented  Airway and Oxygen Therapy: Patient Spontanous Breathing  Post-op Pain: mild  Post-op Assessment: Post-op Vital signs reviewed, Patient's Cardiovascular Status Stable, Respiratory Function Stable, Patent Airway, No signs of Nausea or vomiting and Pain level controlled  Post-op Vital Signs: Reviewed and stable  Last Vitals:  Filed Vitals:   11/14/13 1000  BP:   Pulse: 78  Temp:   Resp: 17    Complications: No apparent anesthesia complications

## 2013-11-14 NOTE — Discharge Instructions (Addendum)
DISCHARGE INSTRUCTIONS: HYSTEROSCOPY / ENDOMETRIAL ABLATION The following instructions have been prepared to help you care for yourself upon your return home.  **You may begin taking Ibuprofen/Motrin/Advil after 3:17 pm today**  Personal hygiene:  Use sanitary pads for vaginal drainage, not tampons.  Shower the day after your procedure.  NO tub baths, pools or Jacuzzis for 2-3 weeks.  Wipe front to back after using the bathroom.  Activity and limitations:  Do NOT drive or operate any equipment for 24 hours. The effects of anesthesia are still present and drowsiness may result.  Do NOT rest in bed all day.  Walking is encouraged.  Walk up and down stairs slowly.  You may resume your normal activity in one to two days or as indicated by your physician. Sexual activity: NO intercourse for at least 2 weeks after the procedure, or as indicated by your Doctor.  Diet: Eat a light meal as desired this evening. You may resume your usual diet tomorrow.  Return to Work: You may resume your work activities in one to two days or as indicated by Therapist, sportsyour Doctor.  What to expect after your surgery: Expect to have vaginal bleeding/discharge for 2-3 days and spotting for up to 10 days. It is not unusual to have soreness for up to 1-2 weeks. You may have a slight burning sensation when you urinate for the first day. Mild cramps may continue for a couple of days. You may have a regular period in 2-6 weeks.  Call your doctor for any of the following:  Excessive vaginal bleeding or clotting, saturating and changing one pad every hour.  Inability to urinate 6 hours after discharge from hospital.  Pain not relieved by pain medication.  Fever of 100.4 F or greater.  Unusual vaginal discharge or odor.   Patients signature: ______________________  Nurses signature ________________________  Support person's signature__________________________

## 2013-11-15 ENCOUNTER — Encounter (HOSPITAL_COMMUNITY): Payer: Self-pay | Admitting: Obstetrics & Gynecology

## 2013-11-15 LAB — HM PAP SMEAR: HM Pap smear: NORMAL

## 2013-11-16 ENCOUNTER — Encounter: Payer: Self-pay | Admitting: Obstetrics & Gynecology

## 2013-11-21 ENCOUNTER — Ambulatory Visit: Payer: Self-pay | Admitting: Obstetrics & Gynecology

## 2013-11-28 ENCOUNTER — Ambulatory Visit: Payer: Managed Care, Other (non HMO) | Admitting: Obstetrics & Gynecology

## 2013-11-29 ENCOUNTER — Encounter: Payer: Self-pay | Admitting: General Practice

## 2013-12-14 ENCOUNTER — Encounter: Payer: Self-pay | Admitting: Obstetrics & Gynecology

## 2013-12-14 ENCOUNTER — Ambulatory Visit (INDEPENDENT_AMBULATORY_CARE_PROVIDER_SITE_OTHER): Payer: Self-pay | Admitting: Obstetrics & Gynecology

## 2013-12-14 VITALS — BP 124/66 | HR 82 | Temp 98.5°F | Resp 20 | Ht 64.0 in | Wt 226.9 lb

## 2013-12-14 DIAGNOSIS — Z09 Encounter for follow-up examination after completed treatment for conditions other than malignant neoplasm: Secondary | ICD-10-CM

## 2013-12-14 NOTE — Progress Notes (Signed)
Subjective:     Patient ID: Barbara Perry, female   DOB: 02-19-1971, 43 y.o.   MRN: [id number]  HPI Pt presents for post op check. She is s/p endometrial ablation.  She reports that she is still spotting.  Denies pain or abnormal discharge outside of the bleeding.      Review of Systems     Objective:   Physical Exam BP 124/66  Pulse 82  Temp(Src) 98.5 F (36.9 C) (Oral)  Resp 20  Ht  (1.626 m)  Wt 226 lb 14.4 oz (102.921 kg)  BMI 38.93 kg/m2 Pt in NAD Abd; soft, NT, ND   11/14/2013 Diagnosis Endometrium, curettage - FRAGMENTS OF ENDOMETRIAL TYPE POLYPS. - BACKGROUND SECRETORY ENDOMETRIUM WITH PSEUDODECIDUALIZATION CONSISTENT WITH PROGESTIN EFFECT AND FOCAL BREAKDOWN. - NO HYPERPLASIA OR MALIGNANCY.    Assessment:     4 week post op check after ablation- still spotting      Plan:     Return to full activities F/u in 3 months Pt to call if daily bleeding still persists after 2 weeks

## 2013-12-14 NOTE — Patient Instructions (Signed)
Endometrial Ablation Endometrial ablation removes the lining of the uterus (endometrium). It is usually a same-day, outpatient treatment. Ablation helps avoid major surgery, such as surgery to remove the cervix and uterus (hysterectomy). After endometrial ablation, you will have little or no menstrual bleeding and may not be able to have children. However, if you are premenopausal, you will need to use a reliable method of birth control following the procedure because of the small chance that pregnancy can occur. There are different reasons to have this procedure, which include:  Heavy periods.  Bleeding that is causing anemia.  Irregular bleeding.  Bleeding fibroids on the lining inside the uterus if they are smaller than 3 centimeters. This procedure should not be done if:  You want children in the future.  You have severe cramps with your menstrual period.  You have precancerous or cancerous cells in your uterus.  You were recently pregnant.  You have gone through menopause.  You have had major surgery on the uterus, such as a cesarean delivery. LET YOUR HEALTH CARE PROVIDER KNOW ABOUT:  Any allergies you have.  All medicines you are taking, including vitamins, herbs, eye drops, creams, and over-the-counter medicines.  Previous problems you or members of your family have had with the use of anesthetics.  Any blood disorders you have.  Previous surgeries you have had.  Medical conditions you have. RISKS AND COMPLICATIONS  Generally, this is a safe procedure. However, as with any procedure, complications can occur. Possible complications include:  Perforation of the uterus.  Bleeding.  Infection of the uterus, bladder, or vagina.  Injury to surrounding organs.  An air bubble to the lung (air embolus).  Pregnancy following the procedure.  Failure of the procedure to help the problem, requiring hysterectomy.  Decreased ability to diagnose cancer in the lining of  the uterus. BEFORE THE PROCEDURE  The lining of the uterus must be tested to make sure there is no pre-cancerous or cancer cells present.  An ultrasound may be performed to look at the size of the uterus and to check for abnormalities.  Medicines may be given to thin the lining of the uterus. PROCEDURE  During the procedure, your health care provider will use a tool called a resectoscope to help see inside your uterus. There are different ways to remove the lining of your uterus.   Radiofrequency - This method uses a radiofrequency-alternating electric current to remove the lining of the uterus.  Cryotherapy - This method uses extreme cold to freeze the lining of the uterus.  Heated-Free Liquid - This method uses heated salt (saline) solution to remove the lining of the uterus.  Microwave - This method uses high-energy microwaves to heat up the lining of the uterus to remove it.  Thermal balloon - This method involves inserting a catheter with a balloon tip into the uterus. The balloon tip is filled with heated fluid to remove the lining of the uterus. AFTER THE PROCEDURE  After your procedure, do not have sexual intercourse or insert anything into your vagina until permitted by your health care provider. After the procedure, you may experience:  Cramps.  Vaginal discharge.  Frequent urination. Document Released: 02/01/2004 Document Revised: 11/24/2012 Document Reviewed: 08/25/2012 Exit[employer] Patient Information 2015 [employer]. This information is not intended to replace advice given to you by your health care provider. Make sure you discuss any questions you have with your health care provider.  

## 2013-12-14 NOTE — Progress Notes (Signed)
Pt states she has had some amount of bleeding every day since her procedure- ranges from light amount to heavy like a period. She wants to know how long this will continue.

## 2014-02-06 ENCOUNTER — Encounter: Payer: Self-pay | Admitting: Obstetrics & Gynecology

## 2015-03-09 ENCOUNTER — Ambulatory Visit (INDEPENDENT_AMBULATORY_CARE_PROVIDER_SITE_OTHER): Payer: Medicaid Other

## 2015-03-09 ENCOUNTER — Encounter: Payer: Self-pay | Admitting: Podiatry

## 2015-03-09 ENCOUNTER — Ambulatory Visit (INDEPENDENT_AMBULATORY_CARE_PROVIDER_SITE_OTHER): Payer: Medicaid Other | Admitting: Podiatry

## 2015-03-09 VITALS — BP 161/102 | HR 65 | Resp 18

## 2015-03-09 DIAGNOSIS — M722 Plantar fascial fibromatosis: Secondary | ICD-10-CM

## 2015-03-09 MED ORDER — DICLOFENAC SODIUM 75 MG PO TBEC: 75.0000 mg | DELAYED_RELEASE_TABLET | Freq: Two times a day (BID) | ORAL | Status: DC

## 2015-03-09 NOTE — Patient Instructions (Signed)

## 2015-03-09 NOTE — Progress Notes (Signed)
Subjective:    Patient ID: Barbara Perry, female    DOB: 01-13-1971, 44 y.o.   MRN: [id number]  HPI  44 year old female presents the office of concerns of pain in the left heel which is been ongoing for probably 6 months. She states that she has pain in the mornings when she first gets up or if she's been sitting down for some time she stands back. She also gets pain to her heel after she works standing for 10 hours a day at work. This has been ongoing for approximate 6 months. She does get some throbbing sensation directed on of her heel. She denies any numbness or tingling. No recent injury or trauma. No swelling or redness. She has changed shoes as well as some over-the-counter inserts without much relief. The pain does not wake her up at night. No other complaints at this time.   Review of Systems  Musculoskeletal: Positive for back pain and gait problem.  All other systems reviewed and are negative.      Objective:   Physical Exam General: AAO x3, NAD  Dermatological: Skin is warm, dry and supple bilateral. Nails x 10 are well manicured; remaining integument appears unremarkable at this time. There are no open sores, no preulcerative lesions, no rash or signs of infection present.  Vascular: Dorsalis Pedis artery and Posterior Tibial artery pedal pulses are 2/4 bilateral with immedate capillary fill time. Pedal hair growth present. No varicosities and no lower extremity edema present bilateral. There is no pain with calf compression, swelling, warmth, erythema.   Neruologic: Grossly intact via light touch bilateral. Vibratory intact via tuning fork bilateral. Protective threshold with Semmes Wienstein monofilament intact to all pedal sites bilateral. Patellar and Achilles deep tendon reflexes 2+ bilateral. No Babinski or clonus noted bilateral. Negative Tinel sign  Musculoskeletal: There is tenderness to palpation along the plantar medial tubercle of the calcaneus at the insertion  the plantar fascia on the left foot. There is no pain on the course of plantar fascial in the arch of the foot. Plantar fascia appears to be intact. No pain with lateral compression of the calcaneus.  Vibratory sensation. No pain on the course last insertion of the Achilles tendon. Mild equinus present. No other areas of tenderness to bilateral lower extremities. No pain, crepitus, or limitation noted with foot and ankle range of motion bilateral. Muscular strength 5/5 in all groups tested bilateral.  Gait: Unassisted, Nonantalgic.      Assessment & Plan:  44 year old female left heel pain, likely plantar fasciitis -Treatment options discussed including all alternatives, risks, and complications -X-rays were obtained and reviewed with the patient.  -Etiology of symptoms were discussed -Patient elects to proceed with steroid injection into the left heel. Under sterile skin preparation, a total of 2.5cc of kenalog 10, 0.5% Marcaine plain, and 2% lidocaine plain were infiltrated into the symptomatic area without complication. A band-aid was applied. Patient tolerated the injection well without complication. Post-injection care with discussed with the patient. Discussed with the patient to ice the area over the next couple of days to help prevent a steroid flare.  -Plantar fascial taping applied. -Prescribed voltaren. Discussed side effects of the medication and directed to stop if any are to occur and call the office.  -Ice and stretching exercises daily. -Continue his shoe gear modifications and inserts. -Follow-up in 3 weeks or sooner if any problems arise. In the meantime, encouraged to call the office with any questions, concerns, change in symptoms.  Ovid CurdMatthew Marysol Wellnitz, DPM

## 2015-03-12 DIAGNOSIS — M722 Plantar fascial fibromatosis: Secondary | ICD-10-CM | POA: Insufficient documentation

## 2015-04-05 IMAGING — US US PELVIS COMPLETE
1 series · 14 of 25 positions shown · non-contrast
Comparison: None

CLINICAL DATA: Dysfunctional uterine bleeding
TECHNIQUE: Both transabdominal and transvaginal ultrasound examinations of the
pelvis were performed. Transabdominal technique was performed for
global imaging of the pelvis including uterus, ovaries, adnexal
regions, and pelvic cul-de-sac. It was necessary to proceed with
endovaginal exam following the transabdominal exam to visualize the
pelvic structures.

[Series 1: us pelvis complete · 14 of 67 slices shown]
[im 1/67]
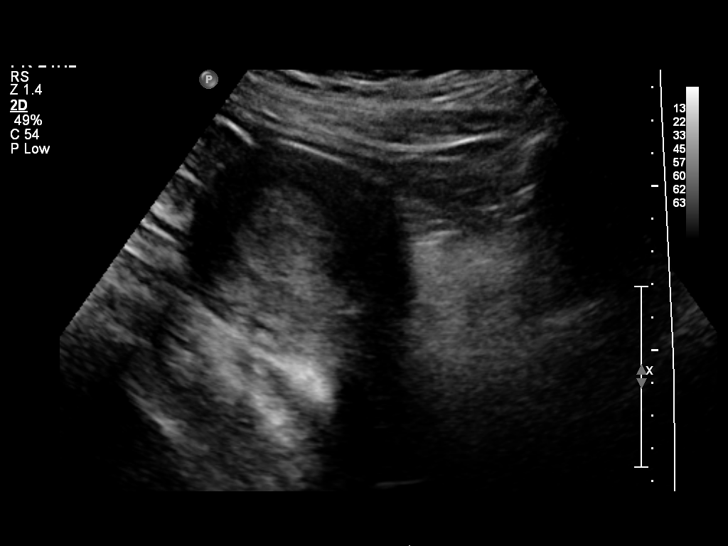
[im 6/67]
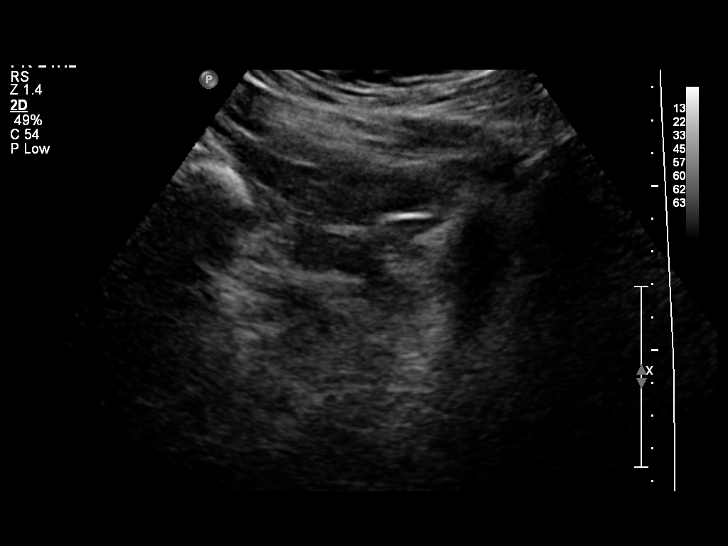
[im 12/67]
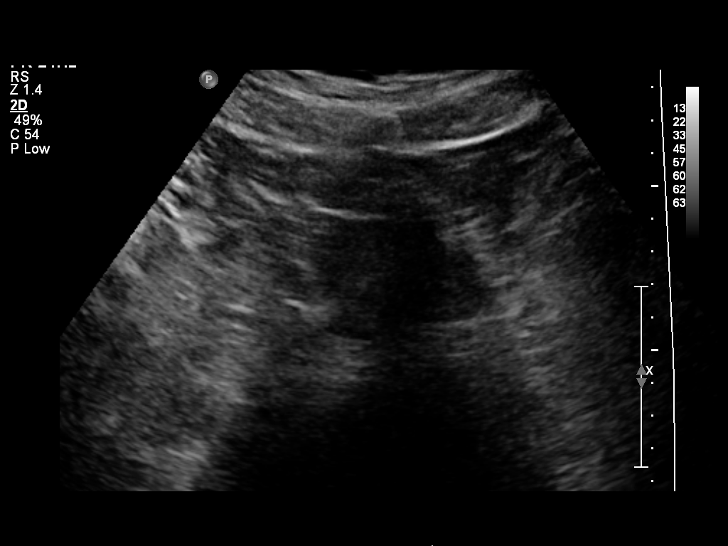
[im 17/67]
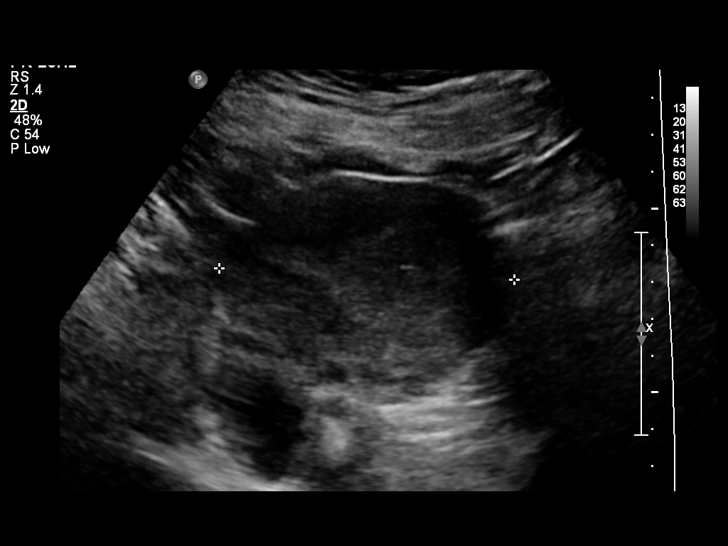
[im 23/67]
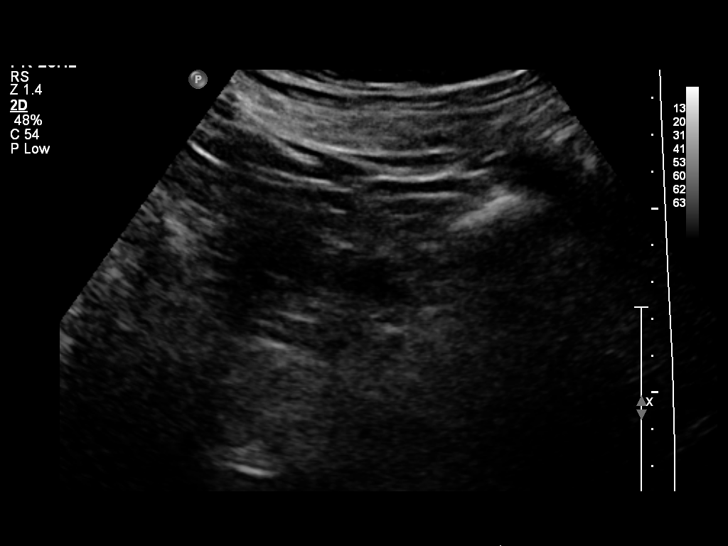
[im 25/67]
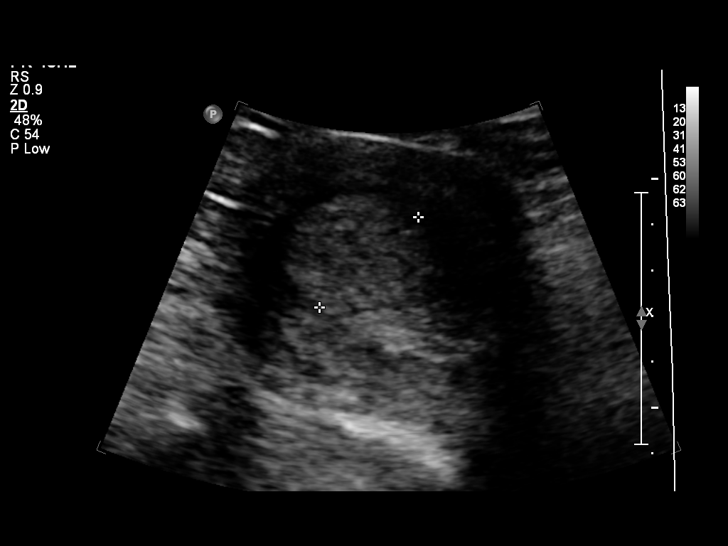
[im 31/67]
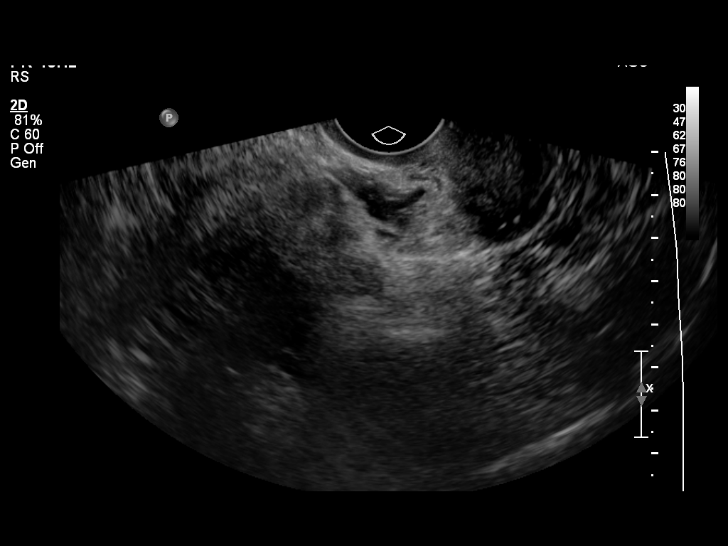
[im 36/67]
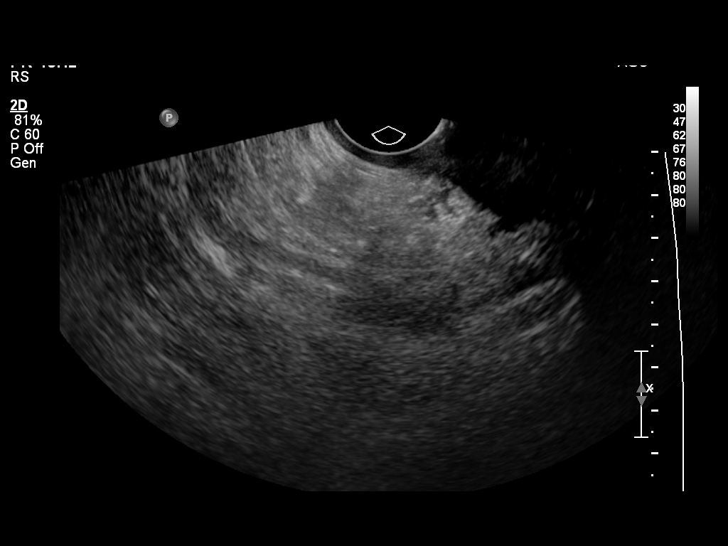
[im 42/67]
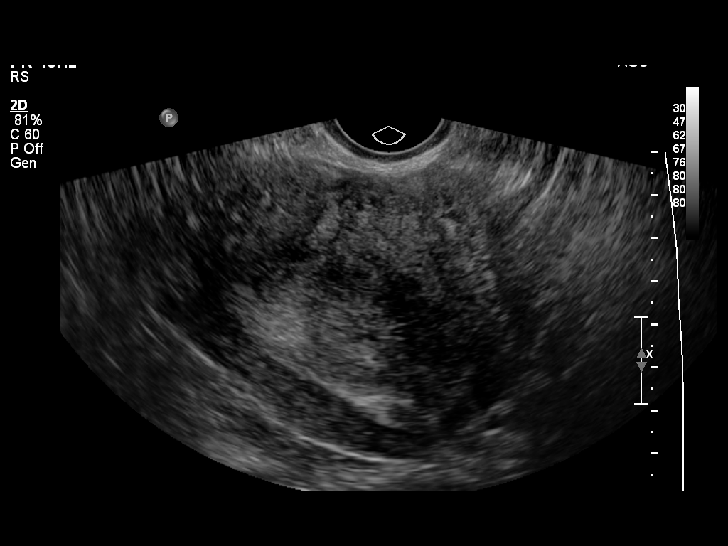
[im 45/67]
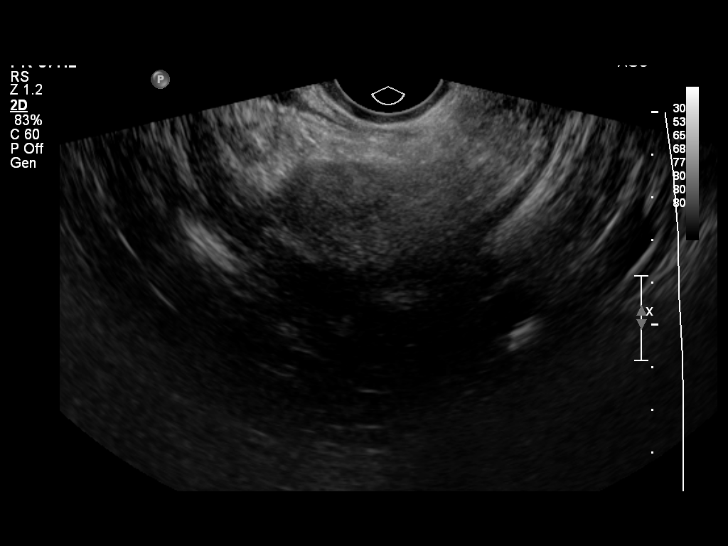
[im 50/67]
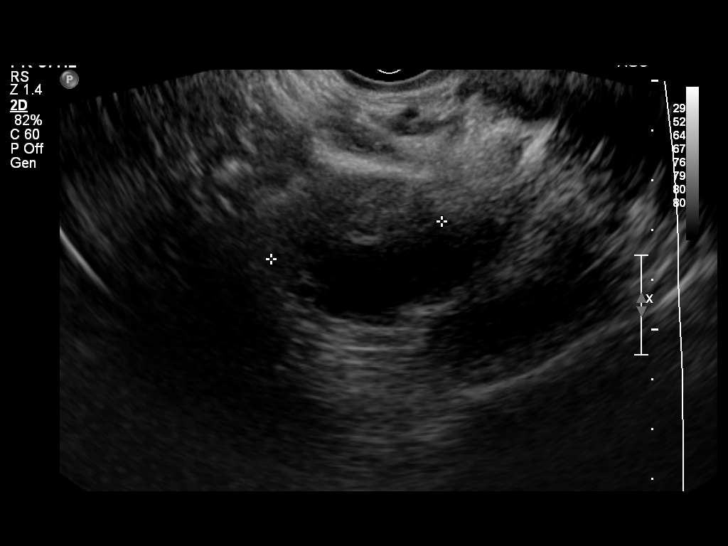
[im 56/67]
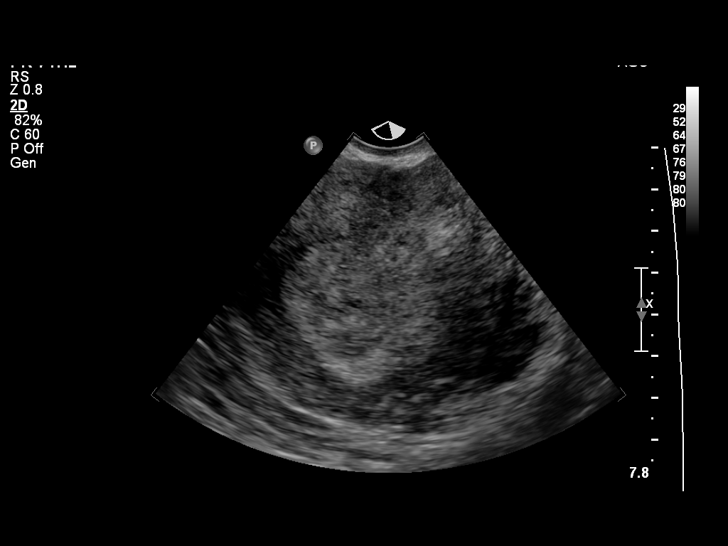
[im 61/67]
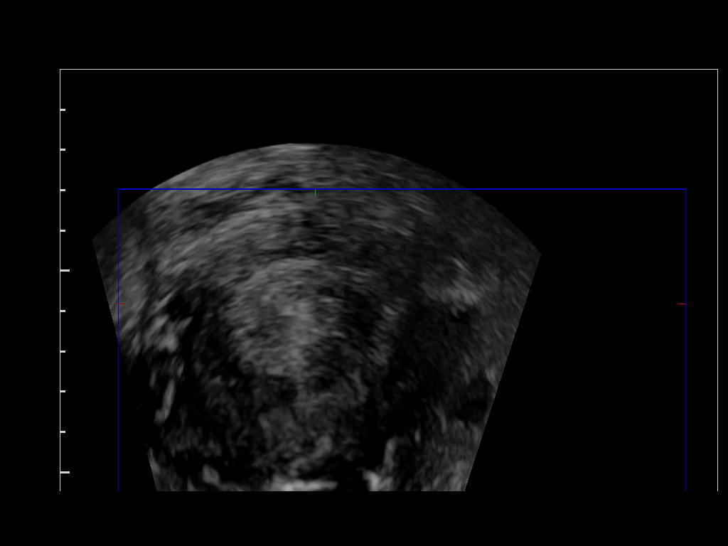
[im 67/67]
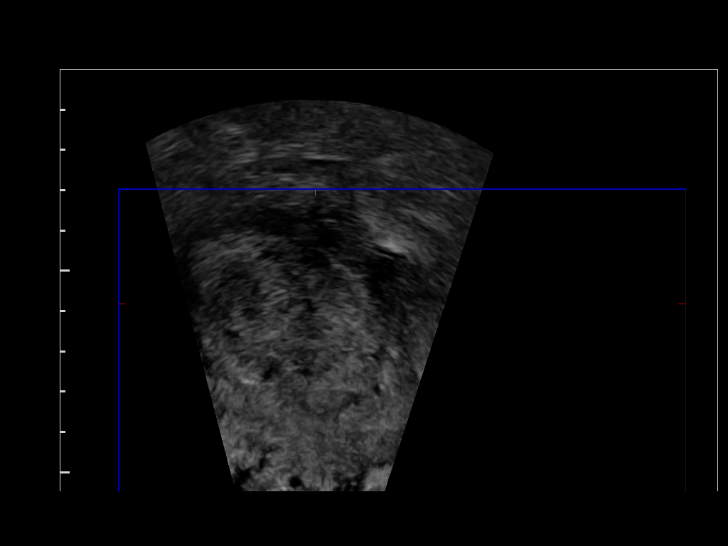

[14 of 25 positions shown; findings below may reference images not displayed]

FINDINGS: Uterus

Measurements: 10.8 x 6.8 x 7.6. No fibroids or other mass
visualized.

Endometrium

Thickness: Thickened at 38 mm..  Heterogeneity is noted.

Right ovary

Measurements: 4.2 x 2.7 x 3.5 cm.. Normal appearance/no adnexal
mass.

Left ovary

Not visualized

Other findings

No free fluid.
IMPRESSION: Prominent heterogeneous endometrium. This may be related to the
patient's current menstrual status

None visualization of the left ovary.

No other focal abnormality is seen.

## 2017-02-15 ENCOUNTER — Emergency Department (HOSPITAL_COMMUNITY)
Admission: EM | Admit: 2017-02-15 | Discharge: 2017-02-15 | Disposition: A | Discharge status: Home / Self Care | Payer: Self-pay | Attending: Emergency Medicine | Admitting: Emergency Medicine

## 2017-02-15 ENCOUNTER — Encounter (HOSPITAL_COMMUNITY): Payer: Self-pay | Admitting: *Deleted

## 2017-02-15 ENCOUNTER — Other Ambulatory Visit: Payer: Self-pay

## 2017-02-15 DIAGNOSIS — Z79899 Other long term (current) drug therapy: Secondary | ICD-10-CM | POA: Insufficient documentation

## 2017-02-15 DIAGNOSIS — K0889 Other specified disorders of teeth and supporting structures: Secondary | ICD-10-CM | POA: Insufficient documentation

## 2017-02-15 MED ORDER — HYDROCODONE-ACETAMINOPHEN 5-325 MG PO TABS: 1.0000 | ORAL_TABLET | ORAL | 0 refills | Status: DC | PRN

## 2017-02-15 MED ORDER — HYDROCODONE-ACETAMINOPHEN 5-325 MG PO TABS
1.0000 | ORAL_TABLET | Freq: Once | ORAL | Status: AC
  Administered 2017-02-15: 1 via ORAL
  Filled 2017-02-15: qty 1

## 2017-02-15 MED ORDER — PENICILLIN V POTASSIUM 500 MG PO TABS: 500.0000 mg | ORAL_TABLET | Freq: Four times a day (QID) | ORAL | 0 refills | Status: AC

## 2017-02-15 NOTE — ED Provider Notes (Signed)
[facility]-EMERGENCY DEPT Provider Note   CSN: [id number] Arrival date & time: 02/15/17  [id number]     History   Chief Complaint Chief Complaint  Patient presents with  . Dental Pain    HPI Barbara Perry is a 46 y.o. female.  HPI    46 year old female presents today with complaints of dental pain.  Patient reports her second molar on the top left has caused discomfort for her in the past.  She seen a dentist in recommended root canal with crown.  She was unable to perform this at that time.  She notes 2 days ago she started to develop worsening pain on the left side associated with that tooth.  She notes movement of the tooth causes significant pain.  Difficulty sleeping, she reports objective swelling around the tooth.  She denies any fever, swelling of the face, neck, or any other infectious etiology.  Patient reports using ibuprofen at home without improvement in her symptoms.  Past Medical History:  Diagnosis Date  . Chronic low back pain 2010  . GERD (gastroesophageal reflux disease)   . Headache, chronic daily 2011  . Vaginal delivery 2000, 2008    Patient Active Problem List   Diagnosis Date Noted  . Plantar fasciitis 03/12/2015  . Hidradenitis suppurativa of right axilla 12/31/2012  . MRSA (methicillin resistant staph aureus) culture positive 12/01/2012  . B12 deficiency anemia 09/03/2012  . Obesity (BMI 35.0-39.9 without comorbidity) 09/03/2012  . Other screening mammogram 07/26/2012  . Anemia, iron deficiency 07/26/2012  . GERD (gastroesophageal reflux disease) 07/22/2012  . H/O abnormal Pap smear 07/22/2012    Past Surgical History:  Procedure Laterality Date  . TUBAL LIGATION      OB History    Gravida Para Term Preterm AB Living   2 2 2  0 0 2   SAB TAB Ectopic Multiple Live Births   0 0 0 0         Home Medications    Prior to Admission medications   Medication Sig Start Date End Date Taking? Authorizing Provider    cyanocobalamin ([facility] VITAMIN B12) 100 MCG tablet Take 1 tablet (100 mcg total) by mouth daily. 09/03/12   Etta GrandchildJones, Thomas L, MD  diclofenac (VOLTAREN) 75 MG EC tablet Take 1 tablet (75 mg total) by mouth 2 (two) times daily. 03/09/15   Vivi BarrackWagoner, Matthew R, DPM  ferrous sulfate (FEOSOL) 325 (65 FE) MG tablet Take 1 tablet (325 mg total) by mouth 3 (three) times daily with meals. 07/26/12   Etta GrandchildJones, Thomas L, MD  HYDROcodone-acetaminophen (NORCO/VICODIN) 5-325 MG tablet Take 1 tablet every 4 (four) hours as needed by mouth. 02/15/17   Caydee Talkington, Tinnie GensJeffrey, PA-C  ibuprofen (ADVIL,MOTRIN) 600 MG tablet Take 1 tablet (600 mg total) by mouth every 6 (six) hours as needed. 11/14/13   Willodean RosenthalHarraway-Smith, Carolyn, MD  megestrol (MEGACE) 20 MG tablet Take 2 tablets (40 mg total) by mouth daily. 08/25/13   Willodean RosenthalHarraway-Smith, Carolyn, MD  Multiple Vitamin (MULTIVITAMIN WITH MINERALS) TABS tablet Take 1 tablet by mouth daily.    [provider]  oxyCODONE-acetaminophen (PERCOCET/ROXICET) 5-325 MG per tablet Take 1-2 tablets by mouth every 6 (six) hours as needed for severe pain. 11/14/13   Willodean RosenthalHarraway-Smith, Carolyn, MD  penicillin v potassium (VEETID) 500 MG tablet Take 1 tablet (500 mg total) 4 (four) times daily for 7 days by mouth. 02/15/17 02/22/17  Azharia Surratt, Tinnie GensJeffrey, PA-C  ranitidine (ZANTAC) 150 MG tablet Take 150 mg by mouth as needed.  07/23/11  Etta Grandchild, MD    Family History Family History  Problem Relation Age of Onset  . Arthritis Mother   . Diabetes Mother   . Alcohol abuse Father   . Hypertension Father   . Cancer Neg Hx   . Early death Neg Hx   . Hearing loss Neg Hx   . Heart disease Neg Hx   . Hyperlipidemia Neg Hx   . Kidney disease Neg Hx   . Stroke Neg Hx     Social History Social History   Tobacco Use  . Smoking status: Never Smoker  . Smokeless tobacco: Never Used  Substance Use Topics  . Alcohol use: Yes    Comment: occasional  . Drug use: No     Allergies   Patient has no  known allergies.   Review of Systems Review of Systems  All other systems reviewed and are negative.    Physical Exam Updated Vital Signs BP 133/86 (BP Location: Left Arm)   Pulse 70   Temp 98.4 F (36.9 C) (Oral)   Resp 18   Ht 5\' 4"  (1.626 m)   Wt 104.3 kg (230 lb)   LMP  (LMP Unknown)   SpO2 99%   BMI 39.48 kg/m   Physical Exam  Constitutional: She is oriented to person, place, and time. She appears well-developed and well-nourished.  HENT:  Head: Normocephalic and atraumatic.  Numerous dental caries noted, darkening of the left second upper molar, tenderness with manipulation, no significant surrounding edema-gumline without fluctuance, no other signs of infectious etiology  Eyes: Conjunctivae are normal. Pupils are equal, round, and reactive to light. Right eye exhibits no discharge. Left eye exhibits no discharge. No scleral icterus.  Neck: Normal range of motion. No JVD present. No tracheal deviation present.  Pulmonary/Chest: Effort normal. No stridor.  Neurological: She is alert and oriented to person, place, and time. Coordination normal.  Psychiatric: She has a normal mood and affect. Her behavior is normal. Judgment and thought content normal.  Nursing note and vitals reviewed.    ED Treatments / Results  Labs (all labs ordered are listed, but only abnormal results are displayed) Labs Reviewed - No data to display  EKG  EKG Interpretation None       Radiology No results found.  Procedures Procedures (including critical care time)  Medications Ordered in ED Medications  HYDROcodone-acetaminophen (NORCO/VICODIN) 5-325 MG per tablet 1 tablet (not administered)     Initial Impression / Assessment and Plan / ED Course  I have reviewed the triage vital signs and the nursing notes.  Pertinent labs & imaging results that were available during my care of the patient were reviewed by me and considered in my medical decision making (see chart for  details).     Final Clinical Impressions(s) / ED Diagnoses   Final diagnoses:  Pain, dental    Labs:   Imaging:  Consults:  Therapeutics:  Discharge Meds:   Assessment/Plan: 46 year old female presents today with complaints of dental pain.  Patient reports subjective swelling, requesting antibiotics.  I find this appropriate in this patient given her level of discomfort.  Patient's pain will be acutely treated, she will be referred to dental resources with strict return precautions.  She verbalized understanding and agreement to today's plan had no further questions or concerns the time discharge.      ED Discharge Orders        Ordered    penicillin v potassium (VEETID) 500 MG tablet  4  times daily     02/15/17 2001    HYDROcodone-acetaminophen (NORCO/VICODIN) 5-325 MG tablet  Every 4 hours PRN     02/15/17 2001       Eyvonne MechanicHedges, Lillias Difrancesco, Cordelia Poche-C 02/15/17 2002    Little, Ambrose Finlandachel Morgan, MD 02/15/17 2340

## 2017-02-15 NOTE — Discharge Instructions (Signed)
Please read attached information. If you experience any new or worsening signs or symptoms please return to the emergency room for evaluation. Please follow-up with your primary care provider or specialist as discussed. Please use medication prescribed only as directed and discontinue taking if you have any concerning signs or symptoms.   °

## 2017-02-15 NOTE — ED Notes (Signed)
[facility] Expected date:  Expected time:  Means of arrival:  Comments: Hold fast track

## 2017-02-15 NOTE — ED Triage Notes (Signed)
Pt stated "The last time I went to the dentist they wanted to do a root canal on this tooth but I couldn't afford it."  Pt c/o left upper back tooth pain since yesterday.

## 2022-05-21 ENCOUNTER — Encounter: Payer: Self-pay | Admitting: Gastroenterology

## 2022-06-26 ENCOUNTER — Encounter: Payer: Self-pay | Admitting: Gastroenterology

## 2022-06-26 ENCOUNTER — Ambulatory Visit (INDEPENDENT_AMBULATORY_CARE_PROVIDER_SITE_OTHER): Payer: Managed Care, Other (non HMO) | Admitting: Gastroenterology

## 2022-06-26 VITALS — BP 136/92 | HR 73 | Ht 64.0 in | Wt 235.0 lb

## 2022-06-26 DIAGNOSIS — Z8507 Personal history of malignant neoplasm of pancreas: Secondary | ICD-10-CM

## 2022-06-26 DIAGNOSIS — R1084 Generalized abdominal pain: Secondary | ICD-10-CM

## 2022-06-26 DIAGNOSIS — R1310 Dysphagia, unspecified: Secondary | ICD-10-CM | POA: Diagnosis not present

## 2022-06-26 DIAGNOSIS — K219 Gastro-esophageal reflux disease without esophagitis: Secondary | ICD-10-CM

## 2022-06-26 DIAGNOSIS — R110 Nausea: Secondary | ICD-10-CM

## 2022-06-26 DIAGNOSIS — Z1211 Encounter for screening for malignant neoplasm of colon: Secondary | ICD-10-CM

## 2022-06-26 MED ORDER — CLENPIQ 10-3.5-12 MG-GM -GM/160ML PO SOLN: 1.0000 | ORAL | 0 refills | Status: DC

## 2022-06-26 NOTE — Progress Notes (Signed)
Chief Complaint: GERD, CRC screening, abdominal pain, changes in stools   Referring Provider:     Mindi Curling, PA-C   HPI:     Barbara Perry is a 52 y.o. female with a history of HLD, obesity (BMI 40), subclinical hypothyroidism, referred to the Gastroenterology Clinic for evaluation of epigastric pain, GERD, nausea, and to discuss CRC screening.  UGI sxs have been present for a few years, but worse over the last few months. Heartburn, regurgitation, then nausea/vomiting and generalized abdominal pain. No clear inciting factors. Additionally, she has intermittent dysphagia with chicken, bread for several months. No food impactions.   Was seen by her PCM for the upper GI symptoms earlier this year.  H. pylori breath test was negative.  Started on Protonix 40 mg twice daily in 05/2022 with overall improvement, but not complete resolution (approx 50% better).   Separately, she is due for routine CRC screening. No previous CRC screening, and o/w no lower GI sxs.   No previous EGD or colonoscopy.  Mother with Pancreatic CA, diagnosed at age 21. Maternal great-grandmother with Pancreatic CA at age 52. No known family history of CRC, liver disease, or IBD.     Past Medical History:  Diagnosis Date   Chronic low back pain 2010   GERD (gastroesophageal reflux disease)    Headache, chronic daily 2011   Vaginal delivery 2000, 2008     Past Surgical History:  Procedure Laterality Date   DILITATION & CURRETTAGE/HYSTROSCOPY WITH NOVASURE ABLATION N/A 11/14/2013   Procedure: ENDOMETRIAL ABLATION WITH NOVASURE with D and C hysteroscopy;  Surgeon: Lavonia Drafts, MD;  Location: [facility];  Service: Gynecology;  Laterality: N/A;   TUBAL LIGATION     Family History  Problem Relation Age of Onset   Arthritis Mother    Diabetes Mother    Pancreatic cancer Mother    Alcohol abuse Father    Hypertension Father    Cirrhosis Father    Colon cancer Neg Hx    Hearing  loss Neg Hx    Heart disease Neg Hx    Hyperlipidemia Neg Hx    Kidney disease Neg Hx    Stroke Neg Hx    Social History   Tobacco Use   Smoking status: Never   Smokeless tobacco: Never  Vaping Use   Vaping Use: Never used  Substance Use Topics   Alcohol use: Yes    Comment: occasional   Drug use: No   Current Outpatient Medications  Medication Sig Dispense Refill   Bacillus Coagulans-Inulin (PROBIOTIC-PREBIOTIC PO) Take 2 each by mouth daily. gummies     ibuprofen (ADVIL,MOTRIN) 600 MG tablet Take 1 tablet (600 mg total) by mouth every 6 (six) hours as needed. 30 tablet 1   Multiple Vitamin (MULTIVITAMIN WITH MINERALS) TABS tablet Take 1 tablet by mouth daily.     ondansetron (ZOFRAN-ODT) 8 MG disintegrating tablet Take 8 mg by mouth every 8 (eight) hours as needed.     pantoprazole (PROTONIX) 40 MG tablet Take 40 mg by mouth 2 (two) times daily.     No current facility-administered medications for this visit.   No Known Allergies   Review of Systems: All systems reviewed and negative except where noted in HPI.     Physical Exam:    Wt Readings from Last 3 Encounters:  06/26/22 235 lb (106.6 kg)  02/15/17 230 lb (104.3 kg)  12/14/13 226 lb 14.4  oz (102.9 kg)    BP (!) 142/92   Pulse 73   Ht 5\' 4"  (1.626 m)   Wt 235 lb (106.6 kg)   SpO2 99%   BMI 40.34 kg/m  Constitutional:  Pleasant, in no acute distress. Psychiatric: Normal mood and affect. Behavior is normal. Cardiovascular: Normal rate, regular rhythm. No edema Pulmonary/chest: Effort normal and breath sounds normal. No wheezing, rales or rhonchi. Abdominal: Soft, nondistended, nontender. Bowel sounds active throughout. There are no masses palpable. No hepatomegaly. Neurological: Alert and oriented to person place and time. Skin: Skin is warm and dry. No rashes noted.   ASSESSMENT AND PLAN;   1) GERD 2) Heartburn 3) Nausea - Improvement (~50-60%) but not resolution w/ an appropriate trial of high  dose PPI.  - EGD to evaluate for erosive esophagitis, LES laxity, hiatal hernia - Will continue current therapy for now, with low threshold to change, particularly if EE on EGD - Continue antireflux lifestyle/dietary modifications   4) Dysphagia - EGD with esophageal dilation and/or esophageal bxs - If unrevealing, will likely plan for EM and pH/Mii testing  5) Colon cancer screening - Colonoscopy  6) Fhx of Pancreatic CA Mother diagnosed with Pancreatic CA at age 16. She in turn has a 2nd degree relative (her grandmother, the patient's great grandmother). This does not meet criteria to initiate PC screening based on familial kindred. We discussed PC screening protocols and CAPS consirtium guidelines.  - Referral to the [facility] and if positive for known pathologic mutation, will further discuss enrollment in PC screening protocol  7) Generalized abdominal pain - Will eval for mucosal/luminal pathology at time of EGD/Colonoscopy as above - If unrevealing, plan for cross sectional imaging   The indications, risks, and benefits of EGD and colonoscopy were explained to the patient in detail. Risks include but are not limited to bleeding, perforation, adverse reaction to medications, and cardiopulmonary compromise. Sequelae include but are not limited to the possibility of surgery, hospitalization, and mortality. The patient verbalized understanding and wished to proceed. All questions answered, referred to scheduler and bowel prep ordered. Further recommendations pending results of the exam.     Lavena Bullion, DO, FACG  06/26/2022, 10:48 AM   Mindi Curling, PA-C

## 2022-06-26 NOTE — Patient Instructions (Addendum)
You have been scheduled for a colonoscopy/ EGD. Please follow written instructions given to you at your visit today.  Please pick up your prep supplies at the pharmacy within the next 1-3 days. If you use inhalers (even only as needed), please bring them with you on the day of your procedure.  We have sent a referral to the genetics clinic, they will reach out and get you scheduled for a visit.   _______________________________________________________  If your blood pressure at your visit was 140/90 or greater, please contact your primary care physician to follow up on this.  _______________________________________________________  If you are age 49 or older, your body mass index should be between 23-30. Your Body mass index is 40.34 kg/m. If this is out of the aforementioned range listed, please consider follow up with your Primary Care Provider.  If you are age 72 or younger, your body mass index should be between 19-25. Your Body mass index is 40.34 kg/m. If this is out of the aformentioned range listed, please consider follow up with your Primary Care Provider.   ________________________________________________________  The [facility] providers would like to encourage you to use [facility] to communicate with providers for non-urgent requests or questions.  Due to long hold times on the telephone, sending your provider a message by [facility] may be a faster and more efficient way to get a response.  Please allow 48 business hours for a response.  Please remember that this is for non-urgent requests.  _______________________________________________________ It was a pleasure to see you today!  Thank you for trusting me with your gastrointestinal care!

## 2022-08-13 ENCOUNTER — Encounter: Payer: Self-pay | Admitting: Certified Registered Nurse Anesthetist

## 2022-08-18 ENCOUNTER — Telehealth: Payer: Self-pay | Admitting: Gastroenterology

## 2022-08-18 ENCOUNTER — Encounter: Payer: Self-pay | Admitting: Gastroenterology

## 2022-08-18 ENCOUNTER — Ambulatory Visit: Payer: Managed Care, Other (non HMO) | Admitting: Gastroenterology

## 2022-08-18 VITALS — BP 126/80 | HR 70 | Temp 98.6°F | Resp 10 | Ht 64.0 in | Wt 235.0 lb

## 2022-08-18 DIAGNOSIS — D122 Benign neoplasm of ascending colon: Secondary | ICD-10-CM | POA: Diagnosis not present

## 2022-08-18 DIAGNOSIS — K2970 Gastritis, unspecified, without bleeding: Secondary | ICD-10-CM

## 2022-08-18 DIAGNOSIS — D128 Benign neoplasm of rectum: Secondary | ICD-10-CM | POA: Diagnosis not present

## 2022-08-18 DIAGNOSIS — K222 Esophageal obstruction: Secondary | ICD-10-CM | POA: Diagnosis not present

## 2022-08-18 DIAGNOSIS — K5730 Diverticulosis of large intestine without perforation or abscess without bleeding: Secondary | ICD-10-CM | POA: Diagnosis not present

## 2022-08-18 DIAGNOSIS — K2950 Unspecified chronic gastritis without bleeding: Secondary | ICD-10-CM | POA: Diagnosis not present

## 2022-08-18 DIAGNOSIS — K219 Gastro-esophageal reflux disease without esophagitis: Secondary | ICD-10-CM

## 2022-08-18 DIAGNOSIS — R1310 Dysphagia, unspecified: Secondary | ICD-10-CM | POA: Diagnosis not present

## 2022-08-18 DIAGNOSIS — R1319 Other dysphagia: Secondary | ICD-10-CM

## 2022-08-18 DIAGNOSIS — Z1211 Encounter for screening for malignant neoplasm of colon: Secondary | ICD-10-CM

## 2022-08-18 DIAGNOSIS — B9681 Helicobacter pylori [H. pylori] as the cause of diseases classified elsewhere: Secondary | ICD-10-CM | POA: Diagnosis not present

## 2022-08-18 MED ORDER — SODIUM CHLORIDE 0.9 % IV SOLN: 500.0000 mL | Freq: Once | INTRAVENOUS | Status: DC

## 2022-08-18 NOTE — Progress Notes (Signed)
1438 Robinul 0.1 mg IV given due large amount of secretions upon assessment.  MD made aware, vss 

## 2022-08-18 NOTE — Patient Instructions (Signed)
Impression/Recommendations:  Polyp, diverticulosis, gastritis and dilation diet handout given to patient.  Resume previous diet. Continue present medications. Await pathology results.  Repeat colonoscopy for surveillance.   Date to be determined after pathology results reviewed.  Repeat upper endoscopy as needed.  Return to GI clinic as needed.  YOU HAD AN ENDOSCOPIC PROCEDURE TODAY AT THE  ENDOSCOPY CENTER:   Refer to the procedure report that was given to you for any specific questions about what was found during the examination.  If the procedure report does not answer your questions, please call your gastroenterologist to clarify.  If you requested that your care partner not be given the details of your procedure findings, then the procedure report has been included in a sealed envelope for you to review at your convenience later.  YOU SHOULD EXPECT: Some feelings of bloating in the abdomen. Passage of more gas than usual.  Walking can help get rid of the air that was put into your GI tract during the procedure and reduce the bloating. If you had a lower endoscopy (such as a colonoscopy or flexible sigmoidoscopy) you may notice spotting of blood in your stool or on the toilet paper. If you underwent a bowel prep for your procedure, you may not have a normal bowel movement for a few days.  Please Note:  You might notice some irritation and congestion in your nose or some drainage.  This is from the oxygen used during your procedure.  There is no need for concern and it should clear up in a day or so.  SYMPTOMS TO REPORT IMMEDIATELY:  Following lower endoscopy (colonoscopy or flexible sigmoidoscopy):  Excessive amounts of blood in the stool  Significant tenderness or worsening of abdominal pains  Swelling of the abdomen that is new, acute  Fever of 100F or higher  Following upper endoscopy (EGD)  Vomiting of blood or coffee ground material  New chest pain or pain under the  shoulder blades  Painful or persistently difficult swallowing  New shortness of breath  Fever of 100F or higher  Black, tarry-looking stools  For urgent or emergent issues, a gastroenterologist can be reached at any hour by calling [phone]. Do not use MyChart messaging for urgent concerns.    DIET:  We do recommend a small meal at first, but then you may proceed to your regular diet.  Drink plenty of fluids but you should avoid alcoholic beverages for 24 hours.  ACTIVITY:  You should plan to take it easy for the rest of today and you should NOT DRIVE or use heavy machinery until tomorrow (because of the sedation medicines used during the test).    FOLLOW UP: Our staff will call the number listed on your records the next business day following your procedure.  We will call around 7:15- 8:00 am to check on you and address any questions or concerns that you may have regarding the information given to you following your procedure. If we do not reach you, we will leave a message.     If any biopsies were taken you will be contacted by phone or by letter within the next 1-3 weeks.  Please call us at [phone] if you have not heard about the biopsies in 3 weeks.    SIGNATURES/CONFIDENTIALITY: You and/or your care partner have signed paperwork which will be entered into your electronic medical record.  These signatures attest to the fact that that the information above on your After Visit Summary has been  reviewed and is understood.  Full responsibility of the confidentiality of this discharge information lies with you and/or your care-partner. 

## 2022-08-18 NOTE — Op Note (Signed)
[facility] Patient Name: Barbara Perry Procedure Date: 08/18/2022 2:32 PM MRN: [id number] Endoscopist: Doristine Locks , MD, [id number] Age: 52 Referring MD:  Date of Birth: 09-07-1970 Gender: Female Account #: [id number] Procedure:                Upper GI endoscopy Indications:              Generalized abdominal pain, Dysphagia, Esophageal                            reflux, Nausea Medicines:                Monitored Anesthesia Care Procedure:                Pre-Anesthesia Assessment:                           - Prior to the procedure, a History and Physical                            was performed, and patient medications and                            allergies were reviewed. The patient's tolerance of                            previous anesthesia was also reviewed. The risks                            and benefits of the procedure and the sedation                            options and risks were discussed with the patient.                            All questions were answered, and informed consent                            was obtained. Prior Anticoagulants: The patient has                            taken no anticoagulant or antiplatelet agents. ASA                            Grade Assessment: III - A patient with severe                            systemic disease. After reviewing the risks and                            benefits, the patient was deemed in satisfactory                            condition to undergo the procedure.  After obtaining informed consent, the endoscope was                            passed under direct vision. Throughout the                            procedure, the patient's blood pressure, pulse, and                            oxygen saturations were monitored continuously. The                            Olympus Scope [id number] was introduced through the                            mouth, and advanced to the  second part of duodenum.                            The upper GI endoscopy was accomplished without                            difficulty. The patient tolerated the procedure                            well. Scope In: Scope Out: Findings:                 A mild Schatzki ring was found in the lower third                            of the esophagus. A TTS dilator was passed through                            the scope. Dilation with an 18-19-20 mm balloon                            dilator was performed to 20 mm. The dilation site                            was examined and showed no bleeding, mucosal tear                            or perforation. Estimated blood loss: none.                           The gastroesophageal flap valve was visualized                            endoscopically and classified as Hill Grade III                            (minimal fold, loose to endoscope, hiatal hernia  likely). The remainder of the esophagus was normal                            appearing.                           Mild inflammation characterized by congestion                            (edema) and erythema was found in the gastric body                            and in the gastric antrum. Biopsies were taken with                            a cold forceps for Helicobacter pylori testing.                            Estimated blood loss was minimal.                           The examined duodenum was normal. Complications:            No immediate complications. Estimated Blood Loss:     Estimated blood loss was minimal. Impression:               - Mild Schatzki ring. Dilated with 20 mm TTS                            balloon.                           - Gastroesophageal flap valve classified as Hill                            Grade III (minimal fold, loose to endoscope, hiatal                            hernia likely).                           - Mild, non-ulcer  gastritis. Biopsied.                           - Normal examined duodenum. Recommendation:           - Patient has a contact number available for                            emergencies. The signs and symptoms of potential                            delayed complications were discussed with the                            patient. Return to normal activities tomorrow.  Written discharge instructions were provided to the                            patient.                           - Resume previous diet.                           - Continue present medications.                           - Await pathology results.                           - Repeat upper endoscopy PRN. Doristine Locks, MD 08/18/2022 3:14:40 PM

## 2022-08-18 NOTE — Progress Notes (Signed)
Pt. Went to the restroom prior to discharge, able to pass more air.  Pt. Reported she belched air in the restroom as well.

## 2022-08-18 NOTE — Telephone Encounter (Signed)
Inbound call from patient notifying before her procedure today 05/13 that she has started Lisinopril blood pressure medication. Please advise, thank you.

## 2022-08-18 NOTE — Progress Notes (Signed)
Dr. Barron Alvine aware pt. Having discomfort, and that pt. is Able to pass air.

## 2022-08-18 NOTE — Progress Notes (Signed)
GASTROENTEROLOGY PROCEDURE H&P NOTE   Primary Care Physician: Patient, No Pcp Per    Reason for Procedure:  GERD, abdominal pain, dysphagia, nausea/vomiting, nausea, colon cancer screening  Plan:    EGD with possible esophageal dilation, colonoscopy  Patient is appropriate for endoscopic procedure(s) in the ambulatory ([facility]) setting.  The nature of the procedure, as well as the risks, benefits, and alternatives were carefully and thoroughly reviewed with the patient. Ample time for discussion and questions allowed. The patient understood, was satisfied, and agreed to proceed.     HPI: Barbara Perry is a 52 y.o. female who presents for EGD for evaluation of GERD, heartburn, nausea, dysphagia, generalized abdominal pain along with colonoscopy for CRC screening.  No previous EGD or colonoscopy.  Past Medical History:  Diagnosis Date   Chronic low back pain 2010   GERD (gastroesophageal reflux disease)    Headache, chronic daily 2011   Vaginal delivery 2000, 2008    Past Surgical History:  Procedure Laterality Date   DILITATION & CURRETTAGE/HYSTROSCOPY WITH NOVASURE ABLATION N/A 11/14/2013   Procedure: ENDOMETRIAL ABLATION WITH NOVASURE with D and C hysteroscopy;  Surgeon: Willodean Rosenthal, MD;  Location: [facility];  Service: Gynecology;  Laterality: N/A;   TUBAL LIGATION      Prior to Admission medications   Medication Sig Start Date End Date Taking? Authorizing Provider  ibuprofen (ADVIL,MOTRIN) 600 MG tablet Take 1 tablet (600 mg total) by mouth every 6 (six) hours as needed. 11/14/13  Yes Harraway-Smith, Eber Jones, MD  lisinopril (ZESTRIL) 10 MG tablet Take by mouth. 07/25/22  Yes [provider]  Multiple Vitamin (MULTIVITAMIN WITH MINERALS) TABS tablet Take 1 tablet by mouth daily.   Yes [provider]  pantoprazole (PROTONIX) 40 MG tablet Take 40 mg by mouth 2 (two) times daily.   Yes [provider]  Bacillus Coagulans-Inulin  (PROBIOTIC-PREBIOTIC PO) Take 2 each by mouth daily. gummies    [provider]  ondansetron (ZOFRAN-ODT) 8 MG disintegrating tablet Take 8 mg by mouth every 8 (eight) hours as needed. 05/08/22   [provider]    Current Outpatient Medications  Medication Sig Dispense Refill   ibuprofen (ADVIL,MOTRIN) 600 MG tablet Take 1 tablet (600 mg total) by mouth every 6 (six) hours as needed. 30 tablet 1   lisinopril (ZESTRIL) 10 MG tablet Take by mouth.     Multiple Vitamin (MULTIVITAMIN WITH MINERALS) TABS tablet Take 1 tablet by mouth daily.     pantoprazole (PROTONIX) 40 MG tablet Take 40 mg by mouth 2 (two) times daily.     Bacillus Coagulans-Inulin (PROBIOTIC-PREBIOTIC PO) Take 2 each by mouth daily. gummies     ondansetron (ZOFRAN-ODT) 8 MG disintegrating tablet Take 8 mg by mouth every 8 (eight) hours as needed.     Current Facility-Administered Medications  Medication Dose Route Frequency Provider Last Rate Last Admin   0.9 %  sodium chloride infusion  500 mL Intravenous Once Hershall Benkert V, DO        Allergies as of 08/18/2022   (No Known Allergies)    Family History  Problem Relation Age of Onset   Arthritis Mother    Diabetes Mother    Pancreatic cancer Mother 75   Alcohol abuse Father    Hypertension Father    Cirrhosis Father    Pancreatic cancer Maternal Great-grandmother 62   Colon cancer Neg Hx    Hearing loss Neg Hx    Heart disease Neg Hx    Hyperlipidemia Neg  Hx    Kidney disease Neg Hx    Stroke Neg Hx     Social History   Socioeconomic History   Marital status: Single    Spouse name: Not on file   Number of children: Not on file   Years of education: Not on file   Highest education level: Not on file  Occupational History   Not on file  Tobacco Use   Smoking status: Never   Smokeless tobacco: Never  Vaping Use   Vaping Use: Never used  Substance and Sexual Activity   Alcohol use: Yes    Comment: occasional   Drug use: No    Sexual activity: Not Currently    Birth control/protection: Surgical  Other Topics Concern   Not on file  Social History Narrative   Not on file   Social Determinants of Health   Financial Resource Strain: Not on file  Food Insecurity: Not on file  Transportation Needs: Not on file  Physical Activity: Not on file  Stress: Not on file  Social Connections: Not on file  Intimate Partner Violence: Not on file    Physical Exam: Vital signs in last 24 hours: @BP  (!) 145/77   Pulse 80   Temp 98.6 F (37 C) (Temporal)   Ht 5\' 4"  (1.626 m)   Wt 235 lb (106.6 kg)   SpO2 98%   BMI 40.34 kg/m  GEN: NAD EYE: Sclerae anicteric ENT: MMM CV: Non-tachycardic Pulm: CTA b/l GI: Soft, NT/ND NEURO:  Alert & Oriented x 3   Doristine Locks, DO [facility]   08/18/2022 2:01 PM

## 2022-08-18 NOTE — Progress Notes (Signed)
Called to room to assist during endoscopic procedure.  Patient ID and intended procedure confirmed with present staff. Received instructions for my participation in the procedure from the performing physician.  

## 2022-08-18 NOTE — Progress Notes (Signed)
VS completed by CW   Pt's states no medical or surgical changes since previsit or office visit.  

## 2022-08-18 NOTE — Telephone Encounter (Signed)
LM on pts VM that she is ok to have started Lisinopril and that she is ok to take today.  Advised her to call back should she have any further questions.

## 2022-08-18 NOTE — Progress Notes (Signed)
Pt. Turning from side to while on stretcher, pt. In Trendelenberg.  Pt. Is passing air well.  Pt. Continues to report discomfort, but has been passing air, and given instructions to facilitate air passage at home.  Pt. Given after hours no. And instructions to call if she has any questions or concerns.

## 2022-08-18 NOTE — Op Note (Signed)
[facility] Patient Name: Barbara Perry Procedure Date: 08/18/2022 2:32 PM MRN: [id number] Endoscopist: Doristine Locks , MD, [id number] Age: 52 Referring MD:  Date of Birth: 05-12-1970 Gender: Female Account #: [id number] Procedure:                Colonoscopy Indications:              Screening for colorectal malignant neoplasm, This                            is the patient's first colonoscopy Medicines:                Monitored Anesthesia Care Procedure:                Pre-Anesthesia Assessment:                           - Prior to the procedure, a History and Physical                            was performed, and patient medications and                            allergies were reviewed. The patient's tolerance of                            previous anesthesia was also reviewed. The risks                            and benefits of the procedure and the sedation                            options and risks were discussed with the patient.                            All questions were answered, and informed consent                            was obtained. Prior Anticoagulants: The patient has                            taken no anticoagulant or antiplatelet agents. ASA                            Grade Assessment: III - A patient with severe                            systemic disease. After reviewing the risks and                            benefits, the patient was deemed in satisfactory                            condition to undergo the procedure.  After obtaining informed consent, the colonoscope                            was passed under direct vision. Throughout the                            procedure, the patient's blood pressure, pulse, and                            oxygen saturations were monitored continuously. The                            Olympus CF-HQ190L ([id number]) Colonoscope was                            introduced through  the anus and advanced to the the                            cecum, identified by appendiceal orifice and                            ileocecal valve. The colonoscopy was technically                            difficult and complex due to significant looping.                            The patient tolerated the procedure well. The                            quality of the bowel preparation was good. The                            ileocecal valve, appendiceal orifice, and rectum                            were photographed. Scope In: 2:51:12 PM Scope Out: 3:09:33 PM Scope Withdrawal Time: 0 hours 11 minutes 54 seconds  Total Procedure Duration: 0 hours 18 minutes 21 seconds  Findings:                 The perianal and digital rectal examinations were                            normal.                           A 3 mm polyp was found in the ascending colon. The                            polyp was sessile. The polyp was removed with a                            cold snare. Resection and retrieval were complete.  Estimated blood loss was minimal.                           A 2 mm polyp was found in the rectum. The polyp was                            sessile. The polyp was removed with a cold snare.                            Resection and retrieval were complete. Estimated                            blood loss was minimal.                           A few small-mouthed diverticula were found in the                            sigmoid colon.                           There was a medium-sized lipoma, in the ascending                            colon.                           The retroflexed view of the distal rectum and anal                            verge was normal and showed no anal or rectal                            abnormalities.                           The ascending colon revealed significantly                            excessive looping. Advancing the scope  required                            using manual pressure. Complications:            No immediate complications. Estimated Blood Loss:     Estimated blood loss was minimal. Impression:               - One 3 mm polyp in the ascending colon, removed                            with a cold snare. Resected and retrieved.                           - One 2 mm polyp in the rectum, removed with a cold  snare. Resected and retrieved.                           - Diverticulosis in the sigmoid colon.                           - Medium-sized lipoma in the ascending colon.                           - The distal rectum and anal verge are normal on                            retroflexion view.                           - There was significant looping of the colon.                           - The GI Genius (intelligent endoscopy module),                            computer-aided polyp detection system powered by AI                            was utilized to detect colorectal polyps through                            enhanced visualization during colonoscopy. Recommendation:           - Patient has a contact number available for                            emergencies. The signs and symptoms of potential                            delayed complications were discussed with the                            patient. Return to normal activities tomorrow.                            Written discharge instructions were provided to the                            patient.                           - Resume previous diet.                           - Continue present medications.                           - Await pathology results.                           - Repeat colonoscopy  for surveillance based on                            pathology results.                           - Return to GI clinic PRN. Doristine Locks, MD 08/18/2022 3:18:09 PM

## 2022-08-19 ENCOUNTER — Telehealth: Payer: Self-pay

## 2022-08-19 NOTE — Telephone Encounter (Signed)
  Follow up Call-     08/18/2022    1:44 PM  Call back number  Post procedure Call Back phone  # [phone]  Permission to leave phone message Yes     Patient questions:  Do you have a fever, pain , or abdominal swelling? No. Pain Score  0 *  Have you tolerated food without any problems? Yes.    Have you been able to return to your normal activities? Yes.    Do you have any questions about your discharge instructions: Diet   No. Medications  No. Follow up visit  No.  Do you have questions or concerns about your Care? No.  Actions: * If pain score is 4 or above: No action needed, pain <4.

## 2022-08-22 NOTE — Progress Notes (Unsigned)
REFERRING PROVIDER: Shellia Cleverly, DO [address] [place],  [place] [address]  PRIMARY PROVIDER:  None listed  PRIMARY REASON FOR VISIT:  Encounter Diagnosis  Name Primary?   Family history of pancreatic cancer Yes    HISTORY OF PRESENT ILLNESS:   Barbara Perry, a 52 y.o. female, was seen for a [place] cancer genetics consultation at the request of Dr. Barron Alvine due to a family history of pancreatic cancer.  Barbara Perry presents to clinic today to discuss the possibility of a hereditary predisposition to cancer, to discuss genetic testing, and to further clarify her future cancer risks, as well as potential cancer risks for family members.   Barbara Perry is a 52 y.o. female with no personal history of cancer.    CANCER HISTORY:  Oncology History   No history exists.    RISK FACTORS:  Mammogram within the last year: yes Number of breast biopsies: 0. Colonoscopy: yes;  most recent in 2024; two tubular adenomas . Hysterectomy: no.  Ovaries intact: yes.  Menarche was at age 49.  First live birth at age 41.  Menopausal status: postmenopausal.  OCP use for approximately  20  years.  HRT use: 0 years. Dermatology screening: no   Past Medical History:  Diagnosis Date   Chronic low back pain 2010   GERD (gastroesophageal reflux disease)    Headache, chronic daily 2011   Vaginal delivery 2000, 2008    Past Surgical History:  Procedure Laterality Date   DILITATION & CURRETTAGE/HYSTROSCOPY WITH NOVASURE ABLATION N/A 11/14/2013   Procedure: ENDOMETRIAL ABLATION WITH NOVASURE with D and C hysteroscopy;  Surgeon: Willodean Rosenthal, MD;  Location: [facility];  Service: Gynecology;  Laterality: N/A;   TUBAL LIGATION       FAMILY HISTORY:  We obtained a detailed, 4-generation family history.  Significant diagnoses are listed below: Family History  Problem Relation Age of Onset   Pancreatic cancer Mother 77   Pancreatic cancer Maternal Great-grandmother 68     Barbara Perry  is unaware of previous family history of genetic testing for hereditary cancer risks. here is no reported Ashkenazi Jewish ancestry. There is no known consanguinity.  GENETIC COUNSELING ASSESSMENT: Barbara Perry is a 52 y.o. female with a family history of pancreatic cancer which is somewhat suggestive of a hereditary cancer syndrome and predisposition to cancer given the presence of pancreatic cancer in multiple generations of the family. We, therefore, discussed and recommended the following at today's visit.   DISCUSSION: We discussed that 5 - 10% of cancer is hereditary, with most cases of hereditary pancreatic cancer associated with mutations in BRCA1/2.  There are other genes that can be associated with hereditary pancreatic cancer syndromes.  We discussed that testing is beneficial for several reasons, including knowing about other cancer risks, identifying potential screening and risk-reduction options that may be appropriate, and to understanding if other family members could be at risk for cancer and allowing them to undergo genetic testing.  We reviewed the characteristics, features and inheritance patterns of hereditary cancer syndromes. We also discussed genetic testing, including the appropriate family members to test, the process of testing, insurance coverage and turn-around-time for results. We discussed the implications of a negative, positive, and variant of uncertain significant result. We discussed that negative results would be uninformative given that Barbara Perry does not have a personal history of cancer. We recommended Barbara Perry pursue genetic testing for a panel that contains genes associated with pancreatic cancer and other cancers.  Ms.  Perry was offered a common hereditary cancer panel (47 genes) and an expanded pan-cancer panel (71 genes). Barbara Perry was informed of the benefits and limitations of each panel, including that expanded pan-cancer panels contain several genes that do  not have clear management guidelines at this point in time.  We also discussed that as the number of genes included on a panel increases, the chances of variants of uncertain significance increases.  After considering the benefits and limitations of each gene panel, Barbara Perry elected to have an expanded Psychologist, occupational through [employer]   The CancerNext-Expanded gene panel offered by [facility] and includes sequencing, rearrangement, and RNA analysis for the following 71 genes:  AIP, ALK, APC, ATM, BAP1, BARD1, BMPR1A, BRCA1, BRCA2, BRIP1, CDC73, CDH1, CDK4, CDKN1B, CDKN2A, CHEK2, DICER1, FH, FLCN, KIF1B, LZTR1,MAX, MEN1, MET, MLH1, MSH2, MSH6, MUTYH, NF1, NF2, NTHL1, PALB2, PHOX2B, PMS2, POT1, PRKAR1A, PTCH1, PTEN, RAD51C,RAD51D, RB1, RET, SDHA, SDHAF2, SDHB, SDHC, SDHD, SMAD4, SMARCA4, SMARCB1, SMARCE1, STK11, SUFU, TMEM127, TP53,TSC1, TSC2 and VHL (sequencing and deletion/duplication); AXIN2, CTNNA1, EGFR, EGLN1, HOXB13, KIT, MITF, MSH3, PDGFRA, POLD1 and POLE (sequencing only); EPCAM and GREM1 (deletion/duplication only).   Based on Barbara Perry's family history of pancreatic cancer, she meets medical criteria for genetic testing. Despite that she meets criteria, she may still have an out of pocket cost. We discussed that if her out of pocket cost for testing is over $100, the laboratory should contact them to discuss self-pay options and/or patient pay assistance programs.   We discussed the Genetic Information Non-Discrimination Act (GINA) of 2008, which helps protect individuals against genetic discrimination based on their genetic test results.  It impacts both health insurance and employment.  With health insurance, it protects against genetic test results being used for increased premiums or policy termination. For employment, it protects against hiring, firing and promoting decisions based on genetic test results.  GINA does not apply to those in the [employer], those who work for companies  with less than 15 employees, and new life insurance or long-term disability insurance policies.  Health status due to a cancer diagnosis is not protected under GINA.  PLAN: After considering the risks, benefits, and limitations, Barbara Perry provided informed consent to pursue genetic testing and the blood sample was sent to [facility] for analysis of the CancerNext-Expanded +RNA Panel. Results should be available within approximately 3 weeks' time, at which point they will be disclosed by telephone to Barbara Perry, as will any additional recommendations warranted by these results. Barbara Perry will receive a summary of her genetic counseling visit and a copy of her results once available. This information will also be available in Epic.    Barbara Perry questions were answered to her satisfaction today. Our contact information was provided should additional questions or concerns arise. Thank you for the referral and allowing [place] to share in the care of your patient.   Teisha Trowbridge M. Rennie Plowman, MS, [facility] Genetic Counselor [email or web address] (P) [phone]   The patient was seen for a total of 30 minutes in face-to-face genetic counseling.  The patient was seen alone.  Drs. Pamelia Hoit and/or Mosetta Putt were available to discuss this case as needed.  _______________________________________________________________________ For Office Staff:  Number of people involved in session: 1 Was an Intern/ student involved with case: no

## 2022-08-25 ENCOUNTER — Telehealth: Payer: Self-pay

## 2022-08-25 ENCOUNTER — Inpatient Hospital Stay: Payer: Managed Care, Other (non HMO)

## 2022-08-25 ENCOUNTER — Encounter: Payer: Self-pay | Admitting: Genetic Counselor

## 2022-08-25 ENCOUNTER — Other Ambulatory Visit: Payer: Self-pay

## 2022-08-25 ENCOUNTER — Inpatient Hospital Stay: Payer: Managed Care, Other (non HMO) | Attending: Genetic Counselor | Admitting: Genetic Counselor

## 2022-08-25 DIAGNOSIS — Z800 Family history of malignant neoplasm of digestive organs: Secondary | ICD-10-CM

## 2022-08-25 DIAGNOSIS — B9681 Helicobacter pylori [H. pylori] as the cause of diseases classified elsewhere: Secondary | ICD-10-CM

## 2022-08-25 LAB — GENETIC SCREENING ORDER

## 2022-08-25 MED ORDER — METRONIDAZOLE 250 MG PO TABS: 250.0000 mg | ORAL_TABLET | Freq: Four times a day (QID) | ORAL | 0 refills | Status: AC

## 2022-08-25 MED ORDER — DOXYCYCLINE HYCLATE 100 MG PO TABS: 100.0000 mg | ORAL_TABLET | Freq: Two times a day (BID) | ORAL | 0 refills | Status: AC

## 2022-08-25 NOTE — Telephone Encounter (Signed)
-----   Message from [facility] V, DO sent at 08/25/2022  4:02 PM EDT ----- The pathology results from your recent upper endoscopy and colonoscopy were reviewed and notable for the following: - The polyps removed during the colonoscopy were Tubular Adenomas.  These types of polyps are considered benign, but potentially precancerous.  These were each removed entirely at the time of colonoscopy.  I recommend repeat colonoscopy in 7 years for ongoing polyp surveillance. - The biopsies from the recent upper GI Endoscopy were notable for H. Pylori gastritis, and will plan on treating with quad therapy as below. Please confirm no medication allergies to the prescribed regimen.   1) continue pantoprazole (Protonix) 40 mg twice daily as currently prescribed 2) Pepto Bismol 2 tabs (262 mg each) 4 times a day x 14 d 3) Metronidazole 250 mg 4 times a day x 14 d 4) doxycycline 100 mg 2 times a day x 14 d  4 weeks after treatment completed, check H. Pylori stool antigen to confirm eradication.  Given need for chronic PPI, will check with [facility] panel.  Dx: H. Pylori gastritis

## 2022-08-25 NOTE — Telephone Encounter (Signed)
Called patient. LVM to return the call. Medications has been sent to patient's pharmacy. Detailed MyChart message sent as well.

## 2022-08-26 NOTE — Telephone Encounter (Signed)
LVM again today to return the call.

## 2022-08-27 NOTE — Telephone Encounter (Signed)
Spoke with the patient. Provided results and recommendations outlined by Dr. Barron Alvine. Informed patient need to check H. Pylori stool antigen to confirm eradication 4 weeks after treatment completed. Advised I would call her to remind when it is time. [facility] misc lab is already ordered in Epic.Sent myself a reminder.

## 2022-09-09 ENCOUNTER — Telehealth: Payer: Self-pay | Admitting: Genetic Counselor

## 2022-09-09 ENCOUNTER — Encounter: Payer: Self-pay | Admitting: Genetic Counselor

## 2022-09-09 DIAGNOSIS — Z1379 Encounter for other screening for genetic and chromosomal anomalies: Secondary | ICD-10-CM | POA: Insufficient documentation

## 2022-09-09 NOTE — Telephone Encounter (Addendum)
Disclosed negative genetics.  Recommended GT for brother given her deceased mother's hx of pancreatic cancer.  Does not meet CAPS guidelines for pancreatic screening.  Knows to keep us/her providers updated on FH cancer.

## 2022-10-10 ENCOUNTER — Other Ambulatory Visit: Payer: Self-pay

## 2022-10-16 DIAGNOSIS — K2970 Gastritis, unspecified, without bleeding: Secondary | ICD-10-CM | POA: Diagnosis not present

## 2022-10-29 ENCOUNTER — Telehealth: Payer: Self-pay | Admitting: Gastroenterology

## 2022-10-29 NOTE — Telephone Encounter (Signed)
Good news, the [facility] panel was negative for H. pylori, consistent with successful eradication.  No further testing needed at this time.

## 2022-10-29 NOTE — Telephone Encounter (Signed)
Left Detailed VM to the patient in regards to [facility]  panel was negative for H. Pylori. MyChart message sent as well.

## 2022-11-06 ENCOUNTER — Ambulatory Visit: Payer: Self-pay | Admitting: Genetic Counselor

## 2022-11-06 ENCOUNTER — Telehealth: Payer: Self-pay | Admitting: Genetic Counselor

## 2022-11-06 DIAGNOSIS — Z1379 Encounter for other screening for genetic and chromosomal anomalies: Secondary | ICD-10-CM

## 2022-11-06 DIAGNOSIS — Z800 Family history of malignant neoplasm of digestive organs: Secondary | ICD-10-CM

## 2022-11-06 NOTE — Progress Notes (Signed)
HPI:   Barbara Perry was previously seen in the [facility] due to a family history of pancreatic cancer and concerns regarding a hereditary predisposition to cancer.    Barbara Perry recent genetic test results were disclosed to her by MyChart after she was unable to reached by telephone. These results and recommendations are discussed in more detail below.  CANCER HISTORY:  Oncology History   No history exists.    FAMILY HISTORY:  We obtained a detailed, 4-generation family history.  Significant diagnoses are listed below:      Family History  Problem Relation Age of Onset   Pancreatic cancer Mother 26   Pancreatic cancer Maternal Great-grandmother 72       Barbara Perry is unaware of previous family history of genetic testing for hereditary cancer risks. here is no reported Ashkenazi Jewish ancestry. There is no known consanguinity.  GENETIC TEST RESULTS:  The [facility] CancerNext-Expanded +RNAinsight Panel found no pathogenic mutations.    The CancerNext-Expanded gene panel offered by [facility] and includes sequencing, rearrangement, and RNA analysis for the following 71 genes:  AIP, ALK, APC, ATM, BAP1, BARD1, BMPR1A, BRCA1, BRCA2, BRIP1, CDC73, CDH1, CDK4, CDKN1B, CDKN2A, CHEK2, DICER1, FH, FLCN, KIF1B, LZTR1,MAX, MEN1, MET, MLH1, MSH2, MSH6, MUTYH, NF1, NF2, NTHL1, PALB2, PHOX2B, PMS2, POT1, PRKAR1A, PTCH1, PTEN, RAD51C,RAD51D, RB1, RET, SDHA, SDHAF2, SDHB, SDHC, SDHD, SMAD4, SMARCA4, SMARCB1, SMARCE1, STK11, SUFU, TMEM127, TP53,TSC1, TSC2 and VHL (sequencing and deletion/duplication); AXIN2, CTNNA1, EGFR, EGLN1, HOXB13, KIT, MITF, MSH3, PDGFRA, POLD1 and POLE (sequencing only); EPCAM and GREM1 (deletion/duplication only).  .   The test report has been scanned into EPIC and is located under the Molecular Pathology section of the Results Review tab.  A portion of the result report is included below for reference. Genetic testing reported out on September 09, 2022.     Even though a pathogenic variant was not identified, possible explanations for the cancer in the family may include: There may be no hereditary risk for cancer in the family. The cancers in Barbara Perry family may be sporadic/familial or due to other genetic and environmental factors. There may be a gene mutation in one of these genes that current testing methods cannot detect but that chance is small. There could be another gene that has not yet been discovered, or that we have not yet tested, that is responsible for the cancer diagnoses in the family.  It is also possible there is a hereditary cause for the cancer in the family that Barbara Perry did not inherit.   Therefore, it is important to remain in touch with cancer genetics in the future so that we can continue to offer Barbara Perry the most up to date genetic testing.   ADDITIONAL GENETIC TESTING:   Barbara Perry genetic testing was fairly extensive.  If there are additional relevant genes identified to increase cancer risk that can be analyzed in the future, we would be happy to discuss and coordinate this testing at that time.      CANCER SCREENING RECOMMENDATIONS:  Barbara Perry test result is considered negative (normal).  This means that we have not identified a hereditary cause for her family history of pancreatic cancer at this time.    An individual's cancer risk and medical management are not determined by genetic test results alone. Overall cancer risk assessment incorporates additional factors, including personal medical history, family history, and any available genetic information that may result in a personalized plan for cancer prevention and surveillance.  Therefore, it is recommended she continue to follow the cancer management and screening guidelines provided by her gastroenterology and primary healthcare provider.  Per the International Cancer of the Pancreas Screening (CAPS) Consortium, individuals with a family  history of pancreatic cancer in at least one first degree relative and one second degree relative should consider annual pancreatic cancer screening.  Barbara Perry does not meet this criteria at this time; however, she should update her providers if she learns of other family history of cancer.    RECOMMENDATIONS FOR FAMILY MEMBERS:   Since she did not inherit a identifiable mutation in a cancer predisposition gene included on this panel, her children could not have inherited a known mutation from her in one of these genes. Individuals in this family might be at some increased risk of developing cancer, over the general population risk, due to the family history of cancer.  Individuals in the family should notify their providers of the family history of cancer.  Other members of the family may still carry a pathogenic variant in one of these genes that Barbara Perry did not inherit. Based on the family history of pancreatic cancer in her deceased mother, we recommend her brother have genetic counseling and testing. Barbara Perry can let us know if we can be of any assistance in coordinating genetic counseling and/or testing for these family member.     FOLLOW-UP:  Cancer genetics is a rapidly advancing field and it is possible that new genetic tests will be appropriate for her and/or her family members in the future. We encourage Barbara Perry to remain in contact with cancer genetics, so we can update her personal and family histories and let her know of advances in cancer genetics that may benefit this family.   Our contact number was provided.  She is welcome to call us at anytime with additional questions or concerns.   Jair Lindblad M. Rennie Plowman, MS, [facility] Genetic Counselor [email or web address] (P) [phone]

## 2022-11-06 NOTE — Telephone Encounter (Signed)
Contacted patient in attempt to disclose results of genetic testing.  LVM with contact information requesting a call back.  Second attempt.  MyChart message sent.

## 2022-11-11 ENCOUNTER — Encounter: Payer: Self-pay | Admitting: Gastroenterology

## 2023-10-16 NOTE — Progress Notes (Signed)
 [facility] Video Visit   Patient ID:  Barbara Perry is a 53 y.o. (DOB Jun 15, 1970) female Place of service: patient home Patient has been advised as to the limitations and limited nature of physical exam due to nature of a video visit, the possibility of privacy risk in the use of a video visit, and that the healthcare provider may recommend visiting a healthcare clinic for in-person care and follow up.   Video Visit Assessment and Plan   1. Primary hypertension (Primary) -     amLODIPine besylate (NORVASC) 5 mg tablet; Take one tablet (5 mg dose) by mouth daily., Starting Fri 10/16/2023, Normal 2. Mixed hyperlipidemia 3. Gastroesophageal reflux disease without esophagitis 4. Class 3 severe obesity due to excess calories with serious comorbidity and body mass index (BMI) of 40.0 to 44.9 in adult 5. Migraine without aura and without status migrainosus, not intractable -     rizatriptan (MAXALT) 10 MG tablet; Take one tablet (10 mg dose) by mouth once as needed for Migraine for up to 1 dose. May repeat in 2 hours if needed, Starting Fri 10/16/2023, Normal 6. Cervical spondylosis -     celecoxib (CELEBREX) 100 mg capsule; Take one capsule (100 mg dose) by mouth 2 (two) times daily., Starting Fri 10/16/2023, Normal 7. Facet arthropathy, cervical 8. OSA (obstructive sleep apnea) 9. Acute cough 10. Genitourinary syndrome of menopause -     estradiol (ESTRACE) 0.1 mg/gram vaginal cream; Place 1g vaginally nightly for 2 weeks then decrease to twice weekly, Normal 11. Post menopausal syndrome 12. Contact dermatitis, unspecified contact dermatitis type, unspecified trigger 13. Gastroesophageal reflux disease, unspecified whether esophagitis present -     pantoprazole sodium (PROTONIX) 40 mg tablet; Take one tablet (40 mg dose) by mouth 2 (two) times daily., Starting Fri 10/16/2023, Normal Other orders -     triamcinolone  acetonide (KENALOG ) 0.1% cream; Apply topically 2 (two) times daily., Starting  Fri 10/16/2023, Normal -     topiramate (TOPAMAX) 25 MG tablet; Take 1 tab at bedtime for 1 week then increase to 2 tabs nightly, Normal    Hypertension controlled continue amlodipine 5 mg daily updated refill she will continue to monitor blood pressure readings from home goal 120s to 130s over 70s 80s  Genitourinary symptoms of menopause stable continue with estradiol vaginal cream  Chronic neck pain related to facet arthropathy cervical cervical spondylosis stable with Celebrex as needed and she is done physical therapy for this in past updated refills  Migraine stable with Maxalt as needed will also add on Topamax  Class III obesity encourage structured lifestyle changes with diet and exercise low sugar low-carb diet.  Avoid fried foods fast foods processed foods eat plenty of lean meats fruits and vegetables drink plenty of water.  Get at least 30 to 45 minutes of cardio exercise 3-4 times weekly.  Will add on Topamax 25 mg 1 tab at bedtime for a week then increase to 2 tabs at bedtime thereafter and follow-up with me in about 6 weeks  GERD stable Protonix  Cough likely due to postnasal drip she will try adding on Claritin once daily and let me know if no better  Contact dermatitis refill triamcinolone  cream  Hyperlipidemia plan to check blood work at next visit lipids  Due for annual physical end of August she will schedule that with me in 6 weeks   Outpatient Encounter Medications as of 10/16/2023:  .  amLODIPine besylate (NORVASC) 5 mg tablet, Take one tablet (5 mg dose)  by mouth daily. .  [DISCONTINUED] amLODIPine besylate (NORVASC) 5 mg tablet, Take one tablet (5 mg dose) by mouth daily. .  [DISCONTINUED] buPROPion HCl (WELLBUTRIN XL) 150 mg 24 hr tablet, TAKE 1 TABLET BY MOUTH IN THE MORNING .  celecoxib (CELEBREX) 100 mg capsule, Take one capsule (100 mg dose) by mouth 2 (two) times daily. .  [DISCONTINUED] celecoxib (CELEBREX) 100 mg capsule, TAKE 1 CAPSULE BY MOUTH ONCE DAILY  AS NEEDED FOR PAIN FOR 30 DAYS .  estradiol (ESTRACE) 0.1 mg/gram vaginal cream, Place 1g vaginally nightly for 2 weeks then decrease to twice weekly .  [DISCONTINUED] estradiol (ESTRACE) 0.1 mg/gram vaginal cream, Place 1g vaginally nightly for 2 weeks then decrease to twice weekly .  ondansetron  (ZOFRAN -ODT) 8 mg disintegrating tablet, Take one tablet (8 mg dose) by mouth every 8 (eight) hours as needed for Nausea. .  pantoprazole sodium (PROTONIX) 40 mg tablet, Take one tablet (40 mg dose) by mouth 2 (two) times daily. .  [DISCONTINUED] pantoprazole sodium (PROTONIX) 40 mg tablet, Take 1 tablet by mouth twice daily .  rizatriptan (MAXALT) 10 MG tablet, Take one tablet (10 mg dose) by mouth once as needed for Migraine for up to 1 dose. May repeat in 2 hours if needed .  [DISCONTINUED] rizatriptan (MAXALT) 10 MG tablet, Take one tablet (10 mg dose) by mouth once as needed for Migraine for up to 1 dose. May repeat in 2 hours if needed .  topiramate (TOPAMAX) 25 MG tablet, Take 1 tab at bedtime for 1 week then increase to 2 tabs nightly .  triamcinolone  acetonide (KENALOG ) 0.1% cream, Apply topically 2 (two) times daily. .  [DISCONTINUED] triamcinolone  acetonide (KENALOG ) 0.1% cream, Apply topically 2 (two) times daily. .  [DISCONTINUED] vitamin D3, cholecalciferol, (OPTIMAL-D) 50,000 units CAPS, Take 1 capsule by mouth once a week   Risk, benefits, and alternatives were provided through patient instructions given to the patient electronically and during the video interaction.  If any worsening symptoms or lack of improvement, the patient will seek immediate medical care.   Video Visit History  No chief complaint on file.    Due for routine follow-up for chronic disease state management and establishing care here with me today.  Ultimately needing follow-up for all chronic disease states.  She was previously seen by Lauraine Burkes but has not been seen since last fall.  Last annual physical was  November 27, 2022.  Hypertension taking amlodipine daily 142/89  pulse 81 this morning which she reports is usually 120s over 80s  Reports cough mostly at night for 1 month.  If she laughs really hard then she will cough all night long when it gets aggravated.  Mostly at night. Does not happen during the day. She has had EGD and colonoscopy and esophagus stretched in the past with dysphagia 1 year ago.   Symptoms improved.  She still has trouble swallowing some things.  She does have seasonal allergies not taking anything for it.  She has OSA but can't tolerate her cpap.  Was not using it enough so insurance stopped covering it.    Class III obesity follow-up -discussed difficulty with losing weight.  She tried Wellbutrin that Lauraine had prescribed her last visit took it for about 2 months but said it did not help so she stopped taking it.  She would like to discuss other options.  She checked with her insurance and it does not cover GLP-1 injections unless she is diabetic  Follow-up on chronic  neck pain cervical spondylosis facet arthropathy cervical she did take Celebrex that Sarah prescribed her last visit and has helped taking intermittently.  Next Needs refills updated of all medications.  She has a rash around her neck that is well-controlled with triamcinolone  cream as needed needs refills  Postmenopausal and overactive bladder symptoms has been well-controlled and treated with estradiol vaginal cream  Migraines under control with Maxalt as needed has a migraine every other month or so.  GERD is well-controlled currently with Protonix she takes it twice daily       Reviewed and updated this visit by provider: Tobacco  Allergies  Meds  Problems  Med Hx  Surg Hx  Fam Hx        ROS:  As documented in the history above, all other relevant system complaints were negative.    Video Visit Objective Findings   Examination conducted with the use of video cameras/computer  monitors. Vital signs and other aspects of physical exam are limited due to the nature of this encounter.   Constitutional:  No apparent acute distress noted during the video interaction; Alert and oriented with normal mentation and verbally interactive. Mood:  Appears appropriate to situation.

## 2023-10-26 DIAGNOSIS — H60393 Other infective otitis externa, bilateral: Secondary | ICD-10-CM | POA: Diagnosis not present

## 2023-10-27 ENCOUNTER — Encounter (INDEPENDENT_AMBULATORY_CARE_PROVIDER_SITE_OTHER): Payer: Self-pay | Admitting: Physician Assistant

## 2023-10-27 ENCOUNTER — Ambulatory Visit (INDEPENDENT_AMBULATORY_CARE_PROVIDER_SITE_OTHER): Admitting: Physician Assistant

## 2023-10-27 VITALS — BP 145/83 | HR 77

## 2023-10-27 DIAGNOSIS — R059 Cough, unspecified: Secondary | ICD-10-CM

## 2023-10-27 DIAGNOSIS — J302 Other seasonal allergic rhinitis: Secondary | ICD-10-CM

## 2023-10-27 DIAGNOSIS — H60393 Other infective otitis externa, bilateral: Secondary | ICD-10-CM

## 2023-10-27 MED ORDER — IBUPROFEN 600 MG PO TABS: 600.0000 mg | ORAL_TABLET | Freq: Three times a day (TID) | ORAL | 0 refills | Status: AC | PRN

## 2023-10-27 MED ORDER — FLUTICASONE PROPIONATE 50 MCG/ACT NA SUSP: 2.0000 | Freq: Every day | NASAL | 6 refills | Status: AC

## 2023-10-27 NOTE — Progress Notes (Signed)
 Dear Dr. Lannette, Here is my assessment for our mutual patient, Barbara Perry. Thank you for allowing me the opportunity to care for your patient. Please do not hesitate to contact me should you have any other questions. Sincerely, Chyrl Cohen PA-C  Otolaryngology Clinic Note Referring provider: Dr. Lannette HPI:  Barbara Perry is a 53 y.o. female kindly referred by Dr. Lannette   The patient is a 53 year old female seen in the office for evaluation of otitis externa.  The patient notes that proximately 5 days ago she started to have soreness in her left ear, she felt like it was swollen.  She notes that the symptoms progressed over to the right side.  She notes the left side has started improved but the right is more severe compared to the left.  She notes she is having difficulty hearing secondary to the swelling of the ears.  She denies any drainage, she denies any fever, she denies any associated neurologic deficits or swelling surrounding the ear.  She notes no history of infections or issues with her ears prior to this noting her hearing was normal.  She denies any water exposure, she is not diabetic, she does not smoke, she denies any trauma to the ears other than using Q-tips.  She reached out to telehealth provider on Friday and was started on Ciprodex but she notes it did not penetrate the ear.  She saw her primary care provider yesterday and was started on oral Augmentin and referred to our office.  She also notes a dry cough at night.  She notes postnasal drainage and a history of seasonal allergies.  She recently started Claritin for this.  She is not taking any other medications, no fever. Independent Review of Additional Tests or Records:  Primary care office visit note 10/26/2023   PMH/Meds/All/SocHx/FamHx/ROS:   Past Medical History:  Diagnosis Date   Chronic low back pain 2010   GERD (gastroesophageal reflux disease)    Headache, chronic daily 2011   Vaginal delivery 2000,  2008     Past Surgical History:  Procedure Laterality Date   DILITATION & CURRETTAGE/HYSTROSCOPY WITH NOVASURE ABLATION N/A 11/14/2013   Procedure: ENDOMETRIAL ABLATION WITH NOVASURE with D and C hysteroscopy;  Surgeon: Elveria Mungo, MD;  Location: [facility];  Service: Gynecology;  Laterality: N/A;   TUBAL LIGATION      Family History  Problem Relation Age of Onset   Arthritis Mother    Diabetes Mother    Pancreatic cancer Mother 16   Alcohol abuse Father    Hypertension Father    Cirrhosis Father    Pancreatic cancer Maternal Great-grandmother 66       MGF's mother   Colon cancer Neg Hx    Hearing loss Neg Hx    Heart disease Neg Hx    Hyperlipidemia Neg Hx    Kidney disease Neg Hx    Stroke Neg Hx      Social Connections: Socially Integrated (10/16/2023)   Received from [facility]   Social Network    How would you rate your social network (family, work, friends)?: Good participation with social networks      Current Outpatient Medications:    Bacillus Coagulans-Inulin (PROBIOTIC-PREBIOTIC PO), Take 2 each by mouth daily. gummies, Disp: , Rfl:    ibuprofen  (ADVIL ,MOTRIN ) 600 MG tablet, Take 1 tablet (600 mg total) by mouth every 6 (six) hours as needed., Disp: 30 tablet, Rfl: 1   lisinopril (ZESTRIL) 10 MG tablet, Take by mouth., Disp: ,  Rfl:    Multiple Vitamin (MULTIVITAMIN WITH MINERALS) TABS tablet, Take 1 tablet by mouth daily., Disp: , Rfl:    ondansetron  (ZOFRAN -ODT) 8 MG disintegrating tablet, Take 8 mg by mouth every 8 (eight) hours as needed., Disp: , Rfl:    pantoprazole (PROTONIX) 40 MG tablet, Take 40 mg by mouth 2 (two) times daily., Disp: , Rfl:    Physical Exam:   BP (!) 145/83   Pulse 77   SpO2 96%   Pertinent Findings  CN II-XII intact Bilateral external ears without swelling, no redness, EACs bilateral with significant swelling, no obvious drainage, minimal pain with manipulation of the right ear, no pain with manipulation of the left, no  pain with palpation of the mastoids Anterior rhinoscopy: Septum midline; bilateral inferior turbinates with hypertrophy No lesions of oral cavity/oropharynx; dentition in normal limits No obviously palpable neck masses/lymphadenopathy/thyromegaly No respiratory distress or stridor  Seprately Identifiable Procedures:  None  Impression & Plans:  [name] is a 53 y.o. female with the following   Otitis externa-  Bilateral otitis externa today.  She is already on Augmentin, I placed ear wicks, she has Ciprodex at home and will use this daily as well.  I like to see her back in the office in 2 weeks for repeat evaluation or sooner as needed.  She verbalized understanding and agreement to today's plan  Cough-  Patient having cough at night when laying down symptoms most likely secondary to postnasal drainage from untreated allergies.  She has started Claritin, I have added Flonase .  She uses medication as needed I will see her in 2 weeks to see if she has had any improvement.   - f/u 2 weeks   Thank you for allowing me the opportunity to care for your patient. Please do not hesitate to contact me should you have any other questions.  Sincerely, Chyrl Cohen PA-C [facility] Phone: [phone] Fax: [phone]  10/27/2023, 1:33 PM

## 2023-11-13 ENCOUNTER — Encounter (INDEPENDENT_AMBULATORY_CARE_PROVIDER_SITE_OTHER): Payer: Self-pay | Admitting: Physician Assistant

## 2023-11-13 ENCOUNTER — Ambulatory Visit (INDEPENDENT_AMBULATORY_CARE_PROVIDER_SITE_OTHER): Admitting: Physician Assistant

## 2023-11-13 VITALS — BP 112/63 | HR 76

## 2023-11-13 DIAGNOSIS — Z8669 Personal history of other diseases of the nervous system and sense organs: Secondary | ICD-10-CM

## 2023-11-13 DIAGNOSIS — H60503 Unspecified acute noninfective otitis externa, bilateral: Secondary | ICD-10-CM

## 2023-11-13 DIAGNOSIS — Z09 Encounter for follow-up examination after completed treatment for conditions other than malignant neoplasm: Secondary | ICD-10-CM | POA: Diagnosis not present

## 2023-11-13 MED ORDER — OFLOXACIN 0.3 % OT SOLN: 5.0000 [drp] | Freq: Every day | OTIC | 0 refills | Status: DC

## 2023-11-13 MED ORDER — OFLOXACIN 0.3 % OT SOLN: 5.0000 [drp] | Freq: Every day | OTIC | 0 refills | Status: AC

## 2023-11-13 NOTE — Progress Notes (Signed)
 Dear Dr. Baird, Here is my assessment for our mutual patient, Brittinee Risk. Thank you for allowing me the opportunity to care for your patient. Please do not hesitate to contact me should you have any other questions. Sincerely, Chyrl Cohen PA-C  Otolaryngology Clinic Note Referring provider: Dr. Baird HPI:  Barbara Perry is a 53 y.o. female kindly referred by Dr. Baird   The patient is a 53 year old female seen in our office for follow-up evaluation of otitis externa. She was last seen in the office on 10/27/2023.  Below is a recap of that encounter.  The patient is a 53 year old female seen in the office for evaluation of otitis externa.  The patient notes that proximately 5 days ago she started to have soreness in her left ear, she felt like it was swollen.  She notes that the symptoms progressed over to the right side.  She notes the left side has started improved but the right is more severe compared to the left.  She notes she is having difficulty hearing secondary to the swelling of the ears.  She denies any drainage, she denies any fever, she denies any associated neurologic deficits or swelling surrounding the ear.  She notes no history of infections or issues with her ears prior to this noting her hearing was normal.  She denies any water exposure, she is not diabetic, she does not smoke, she denies any trauma to the ears other than using Q-tips.  She reached out to telehealth provider on Friday and was started on Ciprodex but she notes it did not penetrate the ear.  She saw her primary care provider yesterday and was started on oral Augmentin and referred to our office.    She also notes a dry cough at night.  She notes postnasal drainage and a history of seasonal allergies.  She recently started Claritin for this.  She is not taking any other medications, no fever.  Update 11/13/2023  Since her last office visit she notes she has been doing well.  She feels like she still has  some muffled hearing out of the right but denies any swelling, no drainage, no pain, no fever.   Independent Review of Additional Tests or Records:  none   PMH/Meds/All/SocHx/FamHx/ROS:   Past Medical History:  Diagnosis Date   Chronic low back pain 2010   GERD (gastroesophageal reflux disease)    Headache, chronic daily 2011   Vaginal delivery 2000, 2008     Past Surgical History:  Procedure Laterality Date   DILITATION & CURRETTAGE/HYSTROSCOPY WITH NOVASURE ABLATION N/A 11/14/2013   Procedure: ENDOMETRIAL ABLATION WITH NOVASURE with D and C hysteroscopy;  Surgeon: Elveria Mungo, MD;  Location: [facility];  Service: Gynecology;  Laterality: N/A;   TUBAL LIGATION      Family History  Problem Relation Age of Onset   Arthritis Mother    Diabetes Mother    Pancreatic cancer Mother 24   Alcohol abuse Father    Hypertension Father    Cirrhosis Father    Pancreatic cancer Maternal Great-grandmother 38       MGF's mother   Colon cancer Neg Hx    Hearing loss Neg Hx    Heart disease Neg Hx    Hyperlipidemia Neg Hx    Kidney disease Neg Hx    Stroke Neg Hx      Social Connections: Socially Integrated (10/16/2023)   Received from [facility]   Social Network    How would you rate your social  network (family, work, friends)?: Good participation with social networks      Current Outpatient Medications:    Bacillus Coagulans-Inulin (PROBIOTIC-PREBIOTIC PO), Take 2 each by mouth daily. gummies, Disp: , Rfl:    fluticasone  (FLONASE ) 50 MCG/ACT nasal spray, Place 2 sprays into both nostrils daily., Disp: 16 g, Rfl: 6   ibuprofen  (ADVIL ) 600 MG tablet, Take 1 tablet (600 mg total) by mouth every 8 (eight) hours as needed., Disp: 30 tablet, Rfl: 0   ibuprofen  (ADVIL ,MOTRIN ) 600 MG tablet, Take 1 tablet (600 mg total) by mouth every 6 (six) hours as needed., Disp: 30 tablet, Rfl: 1   lisinopril (ZESTRIL) 10 MG tablet, Take by mouth., Disp: , Rfl:    Multiple Vitamin  (MULTIVITAMIN WITH MINERALS) TABS tablet, Take 1 tablet by mouth daily., Disp: , Rfl:    ondansetron  (ZOFRAN -ODT) 8 MG disintegrating tablet, Take 8 mg by mouth every 8 (eight) hours as needed., Disp: , Rfl:    pantoprazole (PROTONIX) 40 MG tablet, Take 40 mg by mouth 2 (two) times daily., Disp: , Rfl:    Physical Exam:   BP 112/63   Pulse 76   SpO2 96%   Pertinent Findings  CN II-XII intact Right external auditory canal with persistent cerumen, TM intact with well-pneumatized middle ear space, left EAC clear, TM intact well-pneumatized middle ear space, right EAC with ear wick in place No obvious neck masses/lymphadenopathy/thyromegaly No respiratory distress or stridor  Seprately Identifiable Procedures:  None  Impression & Plans:  Barbara Perry is a 53 y.o. female with the following   Otitis externa-  Otitis externa has resolved, she has persistent wax in the right EAC, I did remove this, there was some friability of the distal proximal external auditory canal with minimal bleeding.  I would recommend otic drops for the next 5 days to help remove any remaining wax and allow the friable tissue to heal that infection.  Once the ear was removed the patient notes her hearing returned back to baseline.  I would like to see the patient back in the office in 1 week if she has any concerns whatsoever, the patient verbalized understanding and agreement to this plan had no further questions or concerns.   - f/u PRN   Thank you for allowing me the opportunity to care for your patient. Please do not hesitate to contact me should you have any other questions.  Sincerely, Chyrl Cohen PA-C [facility] Phone: [phone] Fax: [phone]  11/13/2023, 9:52 AM

## 2023-12-14 DIAGNOSIS — H5213 Myopia, bilateral: Secondary | ICD-10-CM | POA: Diagnosis not present

## 2024-05-20 ENCOUNTER — Ambulatory Visit (INDEPENDENT_AMBULATORY_CARE_PROVIDER_SITE_OTHER): Admitting: Physician Assistant
